# Patient Record
Sex: Female | Born: 1975 | Race: White | Hispanic: No | Marital: Married | Smoking: Never smoker
Health system: Southern US, Community
[De-identification: ages and names within clinical notes are randomized; demographics above are authoritative.]

## PROBLEM LIST (undated history)

## (undated) DIAGNOSIS — F32A Depression, unspecified: Secondary | ICD-10-CM

## (undated) DIAGNOSIS — F419 Anxiety disorder, unspecified: Secondary | ICD-10-CM

## (undated) DIAGNOSIS — K649 Unspecified hemorrhoids: Secondary | ICD-10-CM

## (undated) DIAGNOSIS — E119 Type 2 diabetes mellitus without complications: Secondary | ICD-10-CM

## (undated) DIAGNOSIS — J45909 Unspecified asthma, uncomplicated: Secondary | ICD-10-CM

## (undated) DIAGNOSIS — K219 Gastro-esophageal reflux disease without esophagitis: Secondary | ICD-10-CM

## (undated) DIAGNOSIS — M1990 Unspecified osteoarthritis, unspecified site: Secondary | ICD-10-CM

## (undated) DIAGNOSIS — F329 Major depressive disorder, single episode, unspecified: Secondary | ICD-10-CM

## (undated) DIAGNOSIS — O99619 Diseases of the digestive system complicating pregnancy, unspecified trimester: Secondary | ICD-10-CM

## (undated) HISTORY — DX: Type 2 diabetes mellitus without complications: E11.9

## (undated) HISTORY — DX: Unspecified hemorrhoids: K64.9

## (undated) HISTORY — DX: Depression, unspecified: F32.A

## (undated) HISTORY — DX: Diseases of the digestive system complicating pregnancy, unspecified trimester: O99.619

## (undated) HISTORY — DX: Gastro-esophageal reflux disease without esophagitis: K21.9

## (undated) HISTORY — DX: Anxiety disorder, unspecified: F41.9

## (undated) HISTORY — DX: Major depressive disorder, single episode, unspecified: F32.9

## (undated) HISTORY — DX: Unspecified asthma, uncomplicated: J45.909

## (undated) HISTORY — DX: Unspecified osteoarthritis, unspecified site: M19.90

## (undated) HISTORY — PX: BUNIONECTOMY: SHX129

---

## 2012-09-08 ENCOUNTER — Inpatient Hospital Stay
Admit: 2012-09-08 | Discharge: 2012-09-08 | Disposition: A | Discharge status: Home / Self Care | Attending: General Practice

## 2012-09-08 MED ORDER — ALBUTEROL SULFATE HFA 108 (90 BASE) MCG/ACT IN AERS
108 (90 Base) MCG/ACT | Freq: Four times a day (QID) | RESPIRATORY_TRACT | Status: AC | PRN
Start: 2012-09-08 — End: 2012-09-15

## 2012-09-08 MED ORDER — AZITHROMYCIN 250 MG PO TABS
250 MG | PACK | ORAL | Status: AC
Start: 2012-09-08 — End: 2012-09-18

## 2012-09-08 MED ORDER — FLUTICASONE-SALMETEROL 250-50 MCG/DOSE IN AEPB
250-50 MCG/DOSE | Freq: Two times a day (BID) | RESPIRATORY_TRACT | Status: AC
Start: 2012-09-08 — End: ?

## 2012-09-08 MED ADMIN — ipratropium-albuterol (DUONEB) nebulizer solution 1 ampule: RESPIRATORY_TRACT | @ 20:00:00 | NDC 00487020101

## 2012-09-08 MED FILL — IPRATROPIUM-ALBUTEROL 0.5-2.5 (3) MG/3ML IN SOLN: RESPIRATORY_TRACT | Qty: 3

## 2012-09-08 NOTE — Discharge Instructions (Signed)
Asthma, Adult  Asthma is a disease of the lungs and can make it hard to breathe. Asthma cannot be cured, but medicine can help control it. Asthma may be started (triggered) by:   Pollen.   Dust.   Animal skin flakes (dander).   Molds.   Foods.   Respiratory infections (colds, flu).   Smoke.   Exercise.   Stress.   Other things that cause allergic reactions or allergies (allergens).  HOME CARE    Talk to your doctor about how to manage your attacks at home. This may include:   Using a tool called a peak flow meter.   Having medicine ready to stop the attack.   Take all medicine as told by your doctor.   Wash bed sheets and blankets every week in hot water and put them in the dryer.   Drink enough fluids to keep your pee (urine) clear or pale yellow.   Always be ready to get emergency help. Write down the phone number for your doctor. Keep it where you can easily find it.   Talk about exercise routines with your doctor.   If animal dander is causing your asthma, you may need to find a new home for your pet(s).  GET HELP RIGHT AWAY IF:    You have muscle aches.   You cough more.   You have chest pain.   You have thick spit (sputum) that changes to yellow, green, gray, or bloody.   Medicine does not stop your wheezing.   You have problems breathing.   You have a fever.   Your medicine causes:   A rash.   Itching.   Puffiness (swelling).   Breathing problems.  MAKE SURE YOU:    Understand these instructions.   Will watch your condition.   Will get help right away if you are not doing well or get worse.  Document Released: 03/27/2008 Document Revised: 01/01/2012 Document Reviewed: 08/19/2008  [facility] Patient Information 2013 [place].

## 2012-09-08 NOTE — ED Provider Notes (Signed)
The history is provided by the patient.   Patient states has a history of asthma is out of her inhalers states her doctor passed away and is on a waiting list for a new doctor. States for the past two weeks has had difficulty with her asthma.  Denies any fever.    Review of Systems   Constitutional: Negative for fever and chills.   HENT: Negative for ear pain, sore throat and sinus pressure.    Eyes: Negative for pain, discharge and redness.   Respiratory: Positive for wheezing. Negative for cough and shortness of breath.    Cardiovascular: Negative for chest pain.   Gastrointestinal: Negative for nausea, vomiting, diarrhea and abdominal distention.   Genitourinary: Negative for dysuria and frequency.   Musculoskeletal: Negative for back pain and arthralgias.   Skin: Negative for rash and wound.   Neurological: Negative for weakness and headaches.   Hematological: Negative for adenopathy.   All other systems reviewed and are negative.        Physical Exam   Nursing note and vitals reviewed.  Constitutional: She is oriented to person, place, and time. She appears well-developed and well-nourished.   HENT:   Head: Normocephalic and atraumatic.   Right Ear: Hearing and external ear normal.   Left Ear: Hearing and external ear normal.   Nose: Nose normal.   Mouth/Throat: Uvula is midline, oropharynx is clear and moist and mucous membranes are normal. No oropharyngeal exudate.   Eyes: Conjunctivae, EOM and lids are normal. Pupils are equal, round, and reactive to light.   Neck: Normal range of motion. Neck supple.   Cardiovascular: Normal rate, regular rhythm and normal heart sounds.    No murmur heard.  Pulmonary/Chest: Effort normal. She has wheezes.   Abdominal: Soft. Bowel sounds are normal. There is no tenderness. There is no rigidity, no rebound, no guarding and no CVA tenderness.   Musculoskeletal: She exhibits no edema.   Neurological: She is alert and oriented to person, place, and time. She has normal  strength. No cranial nerve deficit or sensory deficit. Coordination and gait normal. GCS eye subscore is 4. GCS verbal subscore is 5. GCS motor subscore is 6.   Skin: Skin is warm and dry. No abrasion and no rash noted.       Procedures    MDM    Labs      Radiology      EKG Interpretation.    --------------------------------------------- PAST HISTORY ---------------------------------------------  Past Medical History:  has no past medical history on file.    Past Surgical History:  has no past surgical history on file.    Social History:      Family History: family history is not on file.     The patient???s home medications have been reviewed.    Allergies: Review of patient's allergies indicates no known allergies.    -------------------------------------------------- RESULTS -------------------------------------------------  No results found for this visit on 09/08/12.       ------------------------- NURSING NOTES AND VITALS REVIEWED ---------------------------   The nursing notes within the ED encounter and vital signs as below have been reviewed.   BP 117/70   Pulse 98   Temp(Src) 98 ??F (36.7 ??C) (Oral)   Resp 16   Wt 112 lb (50.803 kg)   SpO2 100%   LMP 09/08/2012  Oxygen Saturation Interpretation: Normal      ------------------------------------------ PROGRESS NOTES ------------------------------------------   I have spoken with the patient and discussed today???s results, in addition to  providing specific details for the plan of care and counseling regarding the diagnosis and prognosis.  Their questions are answered at this time and they are agreeable with the plan.      --------------------------------- ADDITIONAL PROVIDER NOTES ---------------------------------          This patient is stable for discharge.  I have shared the specific conditions for return, as well as the importance of follow-up.  After breathing treatment patient states she feels much better. Lungs CTA.    ---------------------------------  IMPRESSION AND DISPOSITION ---------------------------------    IMPRESSION  1. Asthmatic bronchitis        DISPOSITION  Disposition: Discharge to home  Patient condition is good      Marcene Duos, DO  09/08/12 1515

## 2013-04-08 LAB — HM MAMMOGRAPHY: HM Mammogram: NORMAL (ref 0–4)

## 2013-07-03 ENCOUNTER — Inpatient Hospital Stay: Admit: 2013-07-03 | Discharge: 2013-07-03 | Discharge status: Against Medical Advice | Attending: Emergency Medicine

## 2013-07-03 LAB — URINALYSIS
Bilirubin Urine: NEGATIVE
Blood, Urine: NEGATIVE
Glucose, Ur: NEGATIVE mg/dL
Ketones, Urine: NEGATIVE mg/dL
Leukocyte Esterase, Urine: NEGATIVE
Nitrite, Urine: NEGATIVE
Protein, UA: NEGATIVE mg/dL
Specific Gravity, UA: 1.025 (ref 1.005–1.030)
Urobilinogen, Urine: 0.2 E.U./dL (ref <2.0)
pH, UA: 7 (ref 5.0–9.0)

## 2013-07-03 LAB — PREGNANCY, URINE: HCG(Urine) Pregnancy Test: NEGATIVE

## 2013-07-03 MED ORDER — SULFAMETHOXAZOLE-TRIMETHOPRIM 800-160 MG PO TABS
800-160 MG | ORAL_TABLET | Freq: Two times a day (BID) | ORAL | Status: AC
Start: 2013-07-03 — End: 2013-07-13

## 2013-07-03 NOTE — ED Provider Notes (Signed)
Patient is a 37 y.o. female presenting with dysuria. The history is provided by the patient.   Dysuria   This is a new problem. The current episode started more than 1 week ago. The problem occurs every urination. The quality of the pain is described as burning. The pain is moderate. Associated symptoms include frequency, urgency and flank pain. Pertinent negatives include no chills, no nausea, no vomiting, no discharge, no hematuria and no hesitancy.       Review of Systems   Constitutional: Negative for fever and chills.   HENT: Negative for ear pain, sore throat and sinus pressure.    Eyes: Negative for pain, discharge and redness.   Respiratory: Negative for cough, shortness of breath and wheezing.    Cardiovascular: Negative for chest pain.   Gastrointestinal: Negative for nausea, vomiting, diarrhea and abdominal distention.   Genitourinary: Positive for dysuria, urgency, frequency and flank pain. Negative for hesitancy and hematuria.   Musculoskeletal: Negative for back pain and arthralgias.   Skin: Negative for rash and wound.   Neurological: Negative for weakness and headaches.   Hematological: Negative for adenopathy.   All other systems reviewed and are negative.        Physical Exam   Constitutional: She is oriented to person, place, and time. She appears well-developed and well-nourished.   HENT:   Head: Normocephalic and atraumatic.   Eyes: Pupils are equal, round, and reactive to light.   Neck: Normal range of motion. Neck supple.   Cardiovascular: Normal rate, regular rhythm and normal heart sounds.    No murmur heard.  Pulmonary/Chest: Effort normal and breath sounds normal. No respiratory distress. She has no wheezes. She has no rales.   Abdominal: Soft. Bowel sounds are normal. There is tenderness in the suprapubic area. There is CVA tenderness. There is no rebound and no guarding.   Musculoskeletal: She exhibits no edema.   Neurological: She is alert and oriented to person, place, and time. No  cranial nerve deficit. Coordination normal.   Skin: Skin is warm and dry.   Nursing note and vitals reviewed.      Procedures    MDM    --------------------------------------------- PAST HISTORY ---------------------------------------------  Past Medical History:  has no past medical history on file.    Past Surgical History:  has no past surgical history on file.    Social History:  reports that she has never smoked. She does not have any smokeless tobacco history on file.    Family History: family history is not on file.     The patient???s home medications have been reviewed.    Allergies: Review of patient's allergies indicates no known allergies.    -------------------------------------------------- RESULTS -------------------------------------------------  Results for orders placed during the hospital encounter of 07/03/13   URINALYSIS       Result Value Range    Color, UA Yellow  Straw/Yellow    Clarity, UA Clear  Clear    Glucose, Ur Negative  Negative mg/dL    Bilirubin Urine Negative  Negative    Ketones, Urine Negative  Negative mg/dL    Specific Gravity, UA 1.025  1.005 - 1.030    Blood, Urine Negative  Negative    pH, UA 7.0  5.0 - 9.0    Protein, UA Negative  Negative mg/dL    Urobilinogen, Urine 0.2  < 2.0 E.U./dL    Nitrite, Urine Negative  Negative    Leukocyte Esterase, Urine Negative  Negative   PREGNANCY, URINE  Result Value Range    HCG(Urine) Pregnancy Test NEGATIVE  NEGATIVE          ------------------------- NURSING NOTES AND VITALS REVIEWED ---------------------------   The nursing notes within the ED encounter and vital signs as below have been reviewed.   BP 97/42   Pulse 83   Temp(Src) 98.6 ??F (37 ??C) (Oral)   Resp 12   Wt 116 lb (52.617 kg)   LMP 06/08/2013  Oxygen Saturation Interpretation: Normal      ------------------------------------------ PROGRESS NOTES ------------------------------------------   I have spoken with the patient and discussed today???s results, in addition to  providing specific details for the plan of care and counseling regarding the diagnosis and prognosis.  Their questions are answered at this time and they are agreeable with the plan.      --------------------------------- ADDITIONAL PROVIDER NOTES ---------------------------------          This patient is stable for discharge.  I have shared the specific conditions for return, as well as the importance of follow-up.              Delma Officer, MD  07/03/13 [phone]

## 2013-07-03 NOTE — Discharge Instructions (Signed)
Painful Urination (Dysuria): After Your Visit  Your Care Instructions  Burning pain with urination (dysuria) is a common symptom of a urinary tract infection or other urinary problems. The bladder may become inflamed. This can cause pain when the bladder fills and empties. You may also feel pain if the tube that carries urine from the bladder to the outside of the body (urethra) gets irritated or infected.  Sexually transmitted infections (STIs) also may cause pain when you urinate.  Sometimes the pain can be caused by things other than an infection. The urethra can be irritated by soaps, perfumes, or foreign objects in the urethra. Kidney stones can cause pain when they pass through the urethra.  The cause may be hard to find. You may need tests. Treatment for painful urination depends on the cause.  Follow-up care is a key part of your treatment and safety. Be sure to make and go to all appointments, and call your doctor if you are having problems. It's also a good idea to know your test results and keep a list of the medicines you take.  How can you care for yourself at home?   Drink extra water and juices such as cranberry and blueberry juices for the next day or two. This will help make the urine less concentrated. And it may help wash out any bacteria that may be causing an infection. (If you have kidney, heart, or liver disease and have to limit fluids, talk with your doctor before you increase the amount of fluids you drink.)   Avoid drinks that are carbonated or have caffeine. They can irritate the bladder.   Urinate often. Try to empty your bladder each time.  For women:   Urinate right after you have sex.   After going to the bathroom, wipe from front to back.   Avoid douches, bubble baths, and feminine hygiene sprays. And avoid other feminine hygiene products that have deodorants.  When should you call for help?  Call your doctor now or seek immediate medical care if:   You have new symptoms,  such as fever, nausea, or vomiting.   You have new or worse symptoms of a urinary problem. For example:   You have blood or pus in your urine.   You have chills or body aches.   It hurts worse to urinate.   You have groin or belly pain.   You have pain in your back just below your rib cage (the flank area).  Watch closely for changes in your health, and be sure to contact your doctor if you have any problems.   Where can you learn more?   Go to [email or web address] and sign in to your MyChart account. Enter [id number] in the [place] box to learn more about "Painful Urination (Dysuria): After Your Visit."    If you do not have an account, please click on the "Sign Up Now" link.      2006-2014 [employer]. Care instructions adapted under license by [facility]. This care instruction is for use with your licensed healthcare professional. If you have questions about a medical condition or this instruction, always ask your healthcare professional. [place] any warranty or liability for your use of this information.  Content Version: 10.1.311062; Current as of: January 12, 2012

## 2015-08-02 LAB — HM MAMMOGRAPHY: HM Mammogram: NORMAL (ref 0–4)

## 2016-05-03 ENCOUNTER — Inpatient Hospital Stay: Admit: 2016-05-03 | Discharge: 2016-05-03 | Disposition: A | Discharge status: Home / Self Care

## 2016-05-03 DIAGNOSIS — N3001 Acute cystitis with hematuria: Secondary | ICD-10-CM

## 2016-05-03 LAB — PREGNANCY, URINE: HCG(Urine) Pregnancy Test: NEGATIVE

## 2016-05-03 LAB — MICROSCOPIC URINALYSIS

## 2016-05-03 LAB — URINALYSIS
Bilirubin Urine: NEGATIVE
Glucose, Ur: NEGATIVE mg/dL
Nitrite, Urine: NEGATIVE
Protein, UA: NEGATIVE mg/dL
Specific Gravity, UA: 1.02 (ref 1.005–1.030)
Urobilinogen, Urine: 0.2 E.U./dL (ref <2.0)
pH, UA: 6.5 (ref 5.0–9.0)

## 2016-05-03 MED ORDER — CEPHALEXIN 500 MG PO CAPS
500 MG | ORAL_CAPSULE | Freq: Three times a day (TID) | ORAL | 0 refills | Status: AC
Start: 2016-05-03 — End: 2016-05-10

## 2016-05-03 MED ORDER — FLUCONAZOLE 150 MG PO TABS
150 MG | ORAL_TABLET | Freq: Once | ORAL | 0 refills | Status: AC
Start: 2016-05-03 — End: 2016-05-03

## 2016-05-03 NOTE — ED Provider Notes (Signed)
HPI Comments: This is a 40 year old female presents to urgent care complaining of urinary problems for the past couple days. States a history of urinary complaints in the past and UTIs in the past. Has been taking over-the-counter AZO. Denies any chest pain or soreness breath. No vaginal bleeding or discharge.      Review of Systems   Constitutional: Negative for chills and fever.   HENT: Negative for ear pain, sinus pressure and sore throat.    Eyes: Negative for pain, discharge and redness.   Respiratory: Negative for cough, shortness of breath and wheezing.    Cardiovascular: Negative for chest pain.   Gastrointestinal: Negative for abdominal distention, diarrhea, nausea and vomiting.   Genitourinary: Positive for difficulty urinating and dysuria. Negative for frequency.   Musculoskeletal: Negative for arthralgias and back pain.   Skin: Negative for rash and wound.   Neurological: Negative for weakness and headaches.   Hematological: Negative for adenopathy.   All other systems reviewed and are negative.      Physical Exam   Constitutional: She is oriented to person, place, and time. She appears well-developed and well-nourished.   HENT:   Head: Normocephalic and atraumatic.   Right Ear: Hearing and external ear normal.   Left Ear: Hearing and external ear normal.   Nose: Nose normal.   Mouth/Throat: Uvula is midline, oropharynx is clear and moist and mucous membranes are normal.   Eyes: Conjunctivae, EOM and lids are normal. Pupils are equal, round, and reactive to light.   Neck: Normal range of motion. Neck supple.   Cardiovascular: Normal rate, regular rhythm and normal heart sounds.    No murmur heard.  Pulmonary/Chest: Effort normal and breath sounds normal.   Abdominal: Soft. Bowel sounds are normal. There is no tenderness. There is no rigidity, no rebound, no guarding and no CVA tenderness.   Musculoskeletal: She exhibits no edema.   Neurological: She is alert and oriented to person, place, and time. She  has normal strength. No cranial nerve deficit or sensory deficit. Coordination and gait normal. GCS eye subscore is 4. GCS verbal subscore is 5. GCS motor subscore is 6.   Skin: Skin is warm and dry. No abrasion and no rash noted.   Nursing note and vitals reviewed.      Procedures    MDM    --------------------------------------------- PAST HISTORY ---------------------------------------------  Past Medical History:  has no past medical history on file.    Past Surgical History:  has no past surgical history on file.    Social History:  reports that she has never smoked. She does not have any smokeless tobacco history on file.    Family History: family history is not on file.     The patient???s home medications have been reviewed.    Allergies: Review of patient's allergies indicates no known allergies.    -------------------------------------------------- RESULTS -------------------------------------------------  Results for orders placed or performed during the hospital encounter of 05/03/16   Urinalysis   Result Value Ref Range    Color, UA Yellow Straw/Yellow    Clarity, UA SL CLOUDY Clear    Glucose, Ur Negative Negative mg/dL    Bilirubin Urine Negative Negative    Ketones, Urine TRACE (A) Negative mg/dL    Specific Gravity, UA 1.020 1.005 - 1.030    Blood, Urine MODERATE (A) Negative    pH, UA 6.5 5.0 - 9.0    Protein, UA Negative Negative mg/dL    Urobilinogen, Urine 0.2 <2.0 E.U./dL  Nitrite, Urine Negative Negative    Leukocyte Esterase, Urine SMALL (A) Negative   Pregnancy, Urine   Result Value Ref Range    HCG(Urine) Pregnancy Test NEGATIVE NEGATIVE   Microscopic Urinalysis   Result Value Ref Range    WBC, UA 10-20 (A) 0 - 5 /HPF    RBC, UA 5-10 (A) 0 - 2 /HPF    Epi Cells MODERATE /HPF    Bacteria, UA MODERATE (A) /HPF    Yeast, UA RARE      No orders to display       ------------------------- NURSING NOTES AND VITALS REVIEWED ---------------------------   The nursing notes within the ED encounter and  vital signs as below have been reviewed.   BP 114/71   Pulse 80   Temp 99.1 ??F (37.3 ??C) (Oral)    Resp 14   Wt 125 lb (56.7 kg)   LMP 04/17/2016   SpO2 98%  Oxygen Saturation Interpretation: Normal      ------------------------------------------ PROGRESS NOTES ------------------------------------------   I have spoken with the patient and discussed today???s results, in addition to providing specific details for the plan of care and counseling regarding the diagnosis and prognosis.  Their questions are answered at this time and they are agreeable with the plan.      --------------------------------- ADDITIONAL PROVIDER NOTES ---------------------------------      This patient is stable for discharge.  I have shared the specific conditions for return, as well as the importance of follow-up.      * NOTE: This report was transcribed using voice recognition software. Every effort was made to ensure accuracy; however, inadvertent computerized transcription errors may be present.    --------------------------------- IMPRESSION AND DISPOSITION ---------------------------------    IMPRESSION  1. Acute cystitis with hematuria    2. Yeast infection        DISPOSITION  Disposition: Discharge to home  Patient condition is good         Sherryl Barters, [place]  05/03/16 [phone]

## 2016-05-06 LAB — CULTURE, URINE: Urine Culture, Routine: <10000

## 2016-08-07 ENCOUNTER — Ambulatory Visit (INDEPENDENT_AMBULATORY_CARE_PROVIDER_SITE_OTHER): Payer: BLUE CROSS/BLUE SHIELD | Admitting: Physician Assistant

## 2016-08-07 ENCOUNTER — Encounter: Payer: Self-pay | Admitting: Physician Assistant

## 2016-08-07 VITALS — BP 100/70 | HR 81 | Temp 98.0°F | Resp 16 | Ht 65.0 in | Wt 127.0 lb

## 2016-08-07 DIAGNOSIS — J4540 Moderate persistent asthma, uncomplicated: Secondary | ICD-10-CM

## 2016-08-07 DIAGNOSIS — F411 Generalized anxiety disorder: Secondary | ICD-10-CM

## 2016-08-07 DIAGNOSIS — G8929 Other chronic pain: Secondary | ICD-10-CM | POA: Diagnosis not present

## 2016-08-07 DIAGNOSIS — M542 Cervicalgia: Secondary | ICD-10-CM

## 2016-08-07 DIAGNOSIS — F329 Major depressive disorder, single episode, unspecified: Secondary | ICD-10-CM | POA: Diagnosis not present

## 2016-08-07 DIAGNOSIS — F32A Depression, unspecified: Secondary | ICD-10-CM

## 2016-08-07 MED ORDER — CITALOPRAM HYDROBROMIDE 40 MG PO TABS: 40.0000 mg | ORAL_TABLET | Freq: Every day | ORAL | 3 refills | Status: AC

## 2016-08-07 MED ORDER — ALBUTEROL SULFATE HFA 108 (90 BASE) MCG/ACT IN AERS: 2.0000 | INHALATION_SPRAY | Freq: Four times a day (QID) | RESPIRATORY_TRACT | 0 refills | Status: DC | PRN

## 2016-08-07 MED ORDER — FLUTICASONE FUROATE-VILANTEROL 100-25 MCG/INH IN AEPB: 1.0000 | INHALATION_SPRAY | Freq: Every day | RESPIRATORY_TRACT | 0 refills | Status: DC

## 2016-08-08 NOTE — Progress Notes (Signed)
Patient ID: Anna ChesterfieldHolly Clutter MRN: [id number], DOB: 02/09/1976, 40 y.o. Date of Encounter: @DATE @  Chief Complaint:  Chief Complaint  Patient presents with  . Annual Exam    PGQ SCORE 12    HPI: 40 y.o. year old female  presents as a New Pt to Establish Care.   Initially, she was scheduled for CPE but because of so many issues to address and time constraints, more pressing issues were addressed at this visit and she will return for CPE.   She and her family recently moved here from [place] in July.  Her Dad lives here. They moved to be near him, for better job opportunities, and [place]   Says that her prior PCP in [place] died unexpectedly.  She has been waiting to establsih a new PCP so she could address these issues.   Says her Gyn had initially Rxed Celexa for anxiety.  Says she has recently been experiencing increased anxiety and depression.  She becomes tearful and remains tearful through the remainder of the OV.   She also wants to make me aware of the following info, which she then provides:  Her father was adopted. He just recently learned about his biological family and just learned that family history. Learned that his mother was dxed with and died from Breast Cancer--at age 42.  Pt notes that her last mammogram report, which she brought, showed that she has Dense Breast Tissue.   Also says that she has h/o recurrent UTI. Brngs paper work from her last UTI which was July---at that time was treated with Keflex---and also Diflucna--b/c it also showed a yeast infection.  Also brings records of Cervicla Spine XRay---says she has constant pain. Says she has "Cervicla traction and an Inversion Table at home, that she uses"  Also brings past AVS that documents "Asthmatic Bronchitis" as the Dx and says that "this happens any time she gets a chest cold"  Also shows me that at that time she was treated with Albuterol, Advair, Azithromycin.  Says she is out of/needs  refill for Albuterol and Advair. However, says she doesn't like the Advair--"its particles"--wants to try different inhaler.  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 When she has menses--that area throbs--so she wears a knee brace.   Says that she is bringing her daughter, Jennette BankerMaedline, to see my Wednesday.  Says she has Autism, Learning Disability, Sensory Processing D/O, Oppositional Defiant Disorder, ADD.  Says she is 40 y/o. She dresses like a boy. She thinks she likes girls.  She feels like she is a failure as a parent.   Says her husband is also seeing me next week. He is "experiencing out of the roof anxiety".  Says she also has a step son who is 13 y/o---was from husbands prior marriage---he has ODD, ADD, "is emotionally disturbed. He is 42 y/o"  I aksed if she works outside of the home.  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  Is looking for a part time job to work in the evenings.   Says she uses Albuterol > 2 times per week.   I told her today I will increase her Celexa, refill her Pulmonary meds, and will have her schedule CPE at there convenience in the next 1 -2 weeks.  She is agreeable.    Past Medical History:  Diagnosis Date  . Anxiety   . Arthritis   . Asthma   . Depression   .  Diabetes mellitus without complication (HCC)      Home Meds: No outpatient prescriptions prior to visit.   No facility-administered medications prior to visit.     Allergies: Not on File  Social History   Social History  . Marital status: Married    Spouse name: N/A  . Number of children: N/A  . Years of education: N/A   Occupational History  . Not on file.   Social History Main Topics  . Smoking status: Never Smoker  . Smokeless tobacco: Never Used  . Alcohol use Yes     Comment: wine cooler x5 year  . Drug use: No  . Sexual activity: Yes   Other Topics Concern  . Not on file   Social History Narrative  . No narrative on file    Family History    Problem Relation Age of Onset  . Diabetes Father   . Hyperlipidemia Father   . Hypertension Father   . Kidney disease Maternal Uncle   . Arthritis Maternal Grandmother   . Cancer Paternal Grandmother      Review of Systems:  See HPI for pertinent ROS. All other ROS negative.    Physical Exam: Blood pressure 100/70, pulse 81, temperature 98 F (36.7 C), temperature source Oral, resp. rate 16, height 5\' 5"  (1.651 m), weight 127 lb (57.6 kg), last menstrual period 08/06/2016, SpO2 98 %., Body mass index is 21.13 kg/m. General: WNWD WF. Appears in no acute distress. Neck: Supple. No thyromegaly. No lymphadenopathy. Lungs: Clear bilaterally to auscultation without wheezes, rales, or rhonchi. Breathing is unlabored. Heart: RRR with S1 S2. No murmurs, rubs, or gallops. Abdomen: Soft, non-tender, non-distended with normoactive bowel sounds. No hepatomegaly. No rebound/guarding. No obvious abdominal masses. Musculoskeletal:  Strength and tone normal for age. Extremities/Skin: Warm and dry. Neuro: Alert and oriented X 3. Moves all extremities spontaneously. Gait is normal. CNII-XII grossly in tact. Psych:  She is teary through the entire visit. But, she is appropriate with normal eye contact, appropriate.      ASSESSMENT AND PLAN:  40 y.o. year old female with  1. Generalized anxiety disorder Will increase her Celexa to 40mg  QD. Discussed proper expectations o fmedication dose increase--that it will take weeks to see effect.  - citalopram (CELEXA) 40 MG tablet; Take 1 tablet (40 mg total) by mouth daily.  Dispense: 30 tablet; Refill: 3  2. Depression, unspecified depression type Will increase her Celexa to 40mg  QD. Discussed proper expectations o fmedication dose increase--that it will take weeks to see effect.  - citalopram (CELEXA) 40 MG tablet; Take 1 tablet (40 mg total) by mouth daily.  Dispense: 30 tablet; Refill: 3  3. Moderate persistent asthma without complication Will change  Advair to [facility] and see if she likes this any better. Refill Albuterol. - albuterol (PROVENTIL HFA;VENTOLIN HFA) 108 (90 Base) MCG/ACT inhaler; Inhale 2 puffs into the lungs every 6 (six) hours as needed for wheezing or shortness of breath.  Dispense: 1 Inhaler; Refill: 0 - fluticasone furoate-vilanterol (BREO ELLIPTA) 100-25 MCG/INH AEPB; Inhale 1 puff into the lungs daily.  Dispense: 1 each; Refill: 0  4. 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  She will return in 6 -8 weeks to f/u anxiety/depression on increased dose Celexa.   45 minutes spent with patient in direct face-to-face discussion.    [address] [place], [place], [facility] 08/08/2016 12:01 PM

## 2016-08-16 ENCOUNTER — Telehealth: Payer: Self-pay

## 2016-08-16 NOTE — Telephone Encounter (Signed)
A prior Auth is req for Standard PacificBreo Ellipta  The alternatives are Symbicort 80/4.5 INH Aersol 160/4.5 Dulera Inhaler13GM/100/5 Dulera Inhaler 13gm/200/5  Which would you like to change it to?

## 2016-08-16 NOTE — Telephone Encounter (Signed)
The Symbicort.

## 2016-08-22 ENCOUNTER — Telehealth: Payer: Self-pay

## 2016-08-22 NOTE — Telephone Encounter (Signed)
PA started for symbicort

## 2016-08-23 ENCOUNTER — Telehealth: Payer: Self-pay

## 2016-08-23 MED ORDER — BUDESONIDE-FORMOTEROL FUMARATE 80-4.5 MCG/ACT IN AERO: 2.0000 | INHALATION_SPRAY | Freq: Two times a day (BID) | RESPIRATORY_TRACT | 3 refills | Status: DC

## 2016-08-23 NOTE — Telephone Encounter (Signed)
Symbicort covered under ins

## 2016-08-25 NOTE — Telephone Encounter (Signed)
PA was switched to Symbicort and approved 11-1

## 2016-09-18 ENCOUNTER — Encounter: Payer: BLUE CROSS/BLUE SHIELD | Admitting: Physician Assistant

## 2016-10-03 ENCOUNTER — Other Ambulatory Visit: Payer: Self-pay | Admitting: Family Medicine

## 2016-10-03 DIAGNOSIS — Z1231 Encounter for screening mammogram for malignant neoplasm of breast: Secondary | ICD-10-CM

## 2016-10-23 NOTE — L&D Delivery Note (Signed)
Delivery Note   Anna Burch is a 41 y.o. [id number] at 5634w2d Estimated Date of Delivery: 10/16/17  PRE-OPERATIVE DIAGNOSIS:  1) 7434w2d pregnancy.   POST-OPERATIVE DIAGNOSIS:  1) 6534w2d pregnancy s/p Vaginal, Spontaneous   Delivery Type: Vaginal, Spontaneous    Delivery Anesthesia: Epidural   Labor Complications:   prolonged deceleration at complete dilatation     ESTIMATED BLOOD LOSS: 199  ml    FINDINGS:   1) female infant, Apgar scores of 7    at 1 minute and 9    at 5 minutes and a birthweight of 86.77  ounces.    2) Nuchal cord: no  SPECIMENS:   PLACENTA:   Appearance: Intact    Removal: Spontaneous      Disposition:   3 vessel cord, cord blood collect for public donation and for type and screen. It will be held per protocol then discarded.   DISPOSITION:  Infant to left in stable condition in the delivery room, with L&D personnel and mother,  NARRATIVE SUMMARY: Labor course:  Ms. Anna Burch is a [id number] at 6934w2d who presented for SROM.  She progressed well in labor with pitocin.  She received the appropriate anesthesia and proceeded to complete dilation. She evidenced good maternal expulsive effort during the second stage. She went on to deliver a viable female  Infant "Anna Burch" . The placenta delivered without problems and was noted to be complete. A perineal and vaginal examination was performed. Episiotomy/Lacerations: 1st degree;Perineal  Episiotomy or lacerations were repaired with 3-0  Vicryl Rapide suture.   Doreene Burkennie Willie Loy, [name]  10/04/2017 7:13 AM

## 2016-11-07 ENCOUNTER — Ambulatory Visit: Payer: Self-pay

## 2016-12-01 ENCOUNTER — Ambulatory Visit: Payer: Self-pay

## 2016-12-07 ENCOUNTER — Ambulatory Visit
Admission: RE | Admit: 2016-12-07 | Discharge: 2016-12-07 | Disposition: A | Discharge status: Home / Self Care | Payer: BLUE CROSS/BLUE SHIELD | Source: Ambulatory Visit | Attending: Family Medicine | Admitting: Family Medicine

## 2016-12-07 ENCOUNTER — Other Ambulatory Visit: Payer: Self-pay | Admitting: Family Medicine

## 2016-12-07 DIAGNOSIS — Z1231 Encounter for screening mammogram for malignant neoplasm of breast: Secondary | ICD-10-CM | POA: Insufficient documentation

## 2016-12-15 ENCOUNTER — Other Ambulatory Visit: Payer: Self-pay | Admitting: *Deleted

## 2016-12-15 ENCOUNTER — Inpatient Hospital Stay
Admission: RE | Admit: 2016-12-15 | Discharge: 2016-12-15 | Disposition: A | Discharge status: Home / Self Care | Payer: Self-pay | Source: Ambulatory Visit | Attending: *Deleted | Admitting: *Deleted

## 2016-12-15 DIAGNOSIS — Z9289 Personal history of other medical treatment: Secondary | ICD-10-CM

## 2017-03-13 ENCOUNTER — Encounter: Payer: Self-pay | Admitting: Obstetrics and Gynecology

## 2017-03-13 ENCOUNTER — Ambulatory Visit (INDEPENDENT_AMBULATORY_CARE_PROVIDER_SITE_OTHER): Payer: BLUE CROSS/BLUE SHIELD

## 2017-03-13 ENCOUNTER — Ambulatory Visit (INDEPENDENT_AMBULATORY_CARE_PROVIDER_SITE_OTHER): Payer: BLUE CROSS/BLUE SHIELD | Admitting: Obstetrics and Gynecology

## 2017-03-13 VITALS — BP 107/74 | HR 92 | Ht 65.0 in | Wt 130.2 lb

## 2017-03-13 DIAGNOSIS — O09529 Supervision of elderly multigravida, unspecified trimester: Secondary | ICD-10-CM | POA: Diagnosis not present

## 2017-03-13 DIAGNOSIS — R768 Other specified abnormal immunological findings in serum: Secondary | ICD-10-CM | POA: Diagnosis not present

## 2017-03-13 DIAGNOSIS — N83201 Unspecified ovarian cyst, right side: Secondary | ICD-10-CM | POA: Diagnosis not present

## 2017-03-13 DIAGNOSIS — N926 Irregular menstruation, unspecified: Secondary | ICD-10-CM

## 2017-03-13 DIAGNOSIS — F419 Anxiety disorder, unspecified: Secondary | ICD-10-CM

## 2017-03-13 LAB — POCT URINE PREGNANCY: Preg Test, Ur: POSITIVE — AB

## 2017-03-13 NOTE — Patient Instructions (Signed)
First Trimester of Pregnancy The first trimester of pregnancy is from week 1 until the end of week 13 (months 1 through 3). A week after a sperm fertilizes an egg, the egg will implant on the wall of the uterus. This embryo will begin to develop into a baby. Genes from you and your partner will form the baby. The female genes will determine whether the baby will be a boy or a girl. At 6-8 weeks, the eyes and face will be formed, and the heartbeat can be seen on ultrasound. At the end of 12 weeks, all the baby's organs will be formed. Now that you are pregnant, you will want to do everything you can to have a healthy baby. Two of the most important things are to get good prenatal care and to follow your health care provider's instructions. Prenatal care is all the medical care you receive before the baby's birth. This care will help prevent, find, and treat any problems during the pregnancy and childbirth. Body changes during your first trimester Your body goes through many changes during pregnancy. The changes vary from woman to woman.  You may gain or lose a couple of pounds at first.  You may feel sick to your stomach (nauseous) and you may throw up (vomit). If the vomiting is uncontrollable, call your health care provider.  You may tire easily.  You may develop headaches that can be relieved by medicines. All medicines should be approved by your health care provider.  You may urinate more often. Painful urination may mean you have a bladder infection.  You may develop heartburn as a result of your pregnancy.  You may develop constipation because certain hormones are causing the muscles that push stool through your intestines to slow down.  You may develop hemorrhoids or swollen veins (varicose veins).  Your breasts may begin to grow larger and become tender. Your nipples may stick out more, and the tissue that surrounds them (areola) may become darker.  Your gums may bleed and may be  sensitive to brushing and flossing.  Dark spots or blotches (chloasma, mask of pregnancy) may develop on your face. This will likely fade after the baby is born.  Your menstrual periods will stop.  You may have a loss of appetite.  You may develop cravings for certain kinds of food.  You may have changes in your emotions from day to day, such as being excited to be pregnant or being concerned that something may go wrong with the pregnancy and baby.  You may have more vivid and strange dreams.  You may have changes in your hair. These can include thickening of your hair, rapid growth, and changes in texture. Some women also have hair loss during or after pregnancy, or hair that feels dry or thin. Your hair will most likely return to normal after your baby is born.  What to expect at prenatal visits During a routine prenatal visit:  You will be weighed to make sure you and the baby are growing normally.  Your blood pressure will be taken.  Your abdomen will be measured to track your baby's growth.  The fetal heartbeat will be listened to between weeks 10 and 14 of your pregnancy.  Test results from any previous visits will be discussed.  Your health care provider may ask you:  How you are feeling.  If you are feeling the baby move.  If you have had any abnormal symptoms, such as leaking fluid, bleeding, severe headaches,   or abdominal cramping.  If you are using any tobacco products, including cigarettes, chewing tobacco, and electronic cigarettes.  If you have any questions.  Other tests that may be performed during your first trimester include:  Blood tests to find your blood type and to check for the presence of any previous infections. The tests will also be used to check for low iron levels (anemia) and protein on red blood cells (Rh antibodies). Depending on your risk factors, or if you previously had diabetes during pregnancy, you may have tests to check for high blood  sugar that affects pregnant women (gestational diabetes).  Urine tests to check for infections, diabetes, or protein in the urine.  An ultrasound to confirm the proper growth and development of the baby.  Fetal screens for spinal cord problems (spina bifida) and Down syndrome.  HIV (human immunodeficiency virus) testing. Routine prenatal testing includes screening for HIV, unless you choose not to have this test.  You may need other tests to make sure you and the baby are doing well.  Follow these instructions at home: Medicines  Follow your health care provider's instructions regarding medicine use. Specific medicines may be either safe or unsafe to take during pregnancy.  Take a prenatal vitamin that contains at least 600 micrograms (mcg) of folic acid.  If you develop constipation, try taking a stool softener if your health care provider approves. Eating and drinking  Eat a balanced diet that includes fresh fruits and vegetables, whole grains, good sources of protein such as meat, eggs, or tofu, and low-fat dairy. Your health care provider will help you determine the amount of weight gain that is right for you.  Avoid raw meat and uncooked cheese. These carry germs that can cause birth defects in the baby.  Eating four or five small meals rather than three large meals a day may help relieve nausea and vomiting. If you start to feel nauseous, eating a few soda crackers can be helpful. Drinking liquids between meals, instead of during meals, also seems to help ease nausea and vomiting.  Limit foods that are high in fat and processed sugars, such as fried and sweet foods.  To prevent constipation: ? Eat foods that are high in fiber, such as fresh fruits and vegetables, whole grains, and beans. ? Drink enough fluid to keep your urine clear or pale yellow. Activity  Exercise only as directed by your health care provider. Most women can continue their usual exercise routine during  pregnancy. Try to exercise for 30 minutes at least 5 days a week. Exercising will help you: ? Control your weight. ? Stay in shape. ? Be prepared for labor and delivery.  Experiencing pain or cramping in the lower abdomen or lower back is a good sign that you should stop exercising. Check with your health care provider before continuing with normal exercises.  Try to avoid standing for long periods of time. Move your legs often if you must stand in one place for a long time.  Avoid heavy lifting.  Wear low-heeled shoes and practice good posture.  You may continue to have sex unless your health care provider tells you not to. Relieving pain and discomfort  Wear a good support bra to relieve breast tenderness.  Take warm sitz baths to soothe any pain or discomfort caused by hemorrhoids. Use hemorrhoid cream if your health care provider approves.  Rest with your legs elevated if you have leg cramps or low back pain.  If you develop   varicose veins in your legs, wear support hose. Elevate your feet for 15 minutes, 3-4 times a day. Limit salt in your diet. Prenatal care  Schedule your prenatal visits by the twelfth week of pregnancy. They are usually scheduled monthly at first, then more often in the last 2 months before delivery.  Write down your questions. Take them to your prenatal visits.  Keep all your prenatal visits as told by your health care provider. This is important. Safety  Wear your seat belt at all times when driving.  Make a list of emergency phone numbers, including numbers for family, friends, the hospital, and police and fire departments. General instructions  Ask your health care provider for a referral to a local prenatal education class. Begin classes no later than the beginning of month 6 of your pregnancy.  Ask for help if you have counseling or nutritional needs during pregnancy. Your health care provider can offer advice or refer you to specialists for help  with various needs.  Do not use hot tubs, steam rooms, or saunas.  Do not douche or use tampons or scented sanitary pads.  Do not cross your legs for long periods of time.  Avoid cat litter boxes and soil used by cats. These carry germs that can cause birth defects in the baby and possibly loss of the fetus by miscarriage or stillbirth.  Avoid all smoking, herbs, alcohol, and medicines not prescribed by your health care provider. Chemicals in these products affect the formation and growth of the baby.  Do not use any products that contain nicotine or tobacco, such as cigarettes and e-cigarettes. If you need help quitting, ask your health care provider. You may receive counseling support and other resources to help you quit.  Schedule a dentist appointment. At home, brush your teeth with a soft toothbrush and be gentle when you floss. Contact a health care provider if:  You have dizziness.  You have mild pelvic cramps, pelvic pressure, or nagging pain in the abdominal area.  You have persistent nausea, vomiting, or diarrhea.  You have a bad smelling vaginal discharge.  You have pain when you urinate.  You notice increased swelling in your face, hands, legs, or ankles.  You are exposed to fifth disease or chickenpox.  You are exposed to German measles (rubella) and have never had it. Get help right away if:  You have a fever.  You are leaking fluid from your vagina.  You have spotting or bleeding from your vagina.  You have severe abdominal cramping or pain.  You have rapid weight gain or loss.  You vomit blood or material that looks like coffee grounds.  You develop a severe headache.  You have shortness of breath.  You have any kind of trauma, such as from a fall or a car accident. Summary  The first trimester of pregnancy is from week 1 until the end of week 13 (months 1 through 3).  Your body goes through many changes during pregnancy. The changes vary from  woman to woman.  You will have routine prenatal visits. During those visits, your health care provider will examine you, discuss any test results you may have, and talk with you about how you are feeling. This information is not intended to replace advice given to you by your health care provider. Make sure you discuss any questions you have with your health care provider. Document Released: 10/03/2001 Document Revised: 09/20/2016 Document Reviewed: 09/20/2016 Elsevier Interactive Patient Education  2017 Elsevier   Inc.  

## 2017-03-13 NOTE — Progress Notes (Signed)
Subjective:     Patient ID: Anna Burch, female   DOB: 01/09/1976, 41 y.o.   MRN: [id number]  HPI Reports unsure of exact timing of LMP due to irregular menses. Thought she was going through early menopause and has been under a lot of stress with getting custody of 43 year old step son. FT homemaker.   [id number], 2 EAB, VAD x2 and SVD x1,   Tearful as spouse had vasectomy 12 years ago, and she has not had any other sexual partners. Also is taking celexa for anxiety and Diclofinac for neck pain and is worried about potential effects on fetus. Reports feeling really bloated for last 2 weeks and nausea x 2 days. Took UPT at home + 2 days ago.   Review of Systems Negative except stated in HPI    Objective:   Physical Exam A&Ox4 Well groomed female in no distress, but tearful Blood pressure 107/74, pulse 92, height 5\' 5"  (1.651 m), weight 130 lb 3.2 oz (59.1 kg), last menstrual period 01/24/2017. UPT+ Pelvic exam: VULVA: normal appearing vulva with no masses, tenderness or lesions, VAGINA: normal appearing vagina with normal color and discharge, no lesions, CERVIX: normal appearing cervix without discharge or lesions, multiparous os, UTERUS: enlarged to 11 week's size, ADNEXA: mass present right side, size 6 cm, WET MOUNT done - results: negative for pathogens, normal epithelial cells.   pelvic u/s today reveals: Indications:Dating and viability Findings:  Singleton intrauterine pregnancy is visualized with a CRL consistent with [redacted] weeks gestation, giving an (U/S) EDD of 10/16/17.  FHR: 175 CRL measurement: 23.3 mm Yolk sac and and early anatomy is normal.  Right Ovary measures 6.3 x 4.5 x 5.4 cm. Simple Right ovarian cyst measures 5.9 x 4.9 x 4.7 cm. Left Ovary measures 3.3 x 2.1 x 1.6 cm. It is normal appearance. There is evidence of a corpus luteal cyst in the Right Survey of the adnexa demonstrates no adnexal masses. There is no free peritoneal fluid in the cul de sac.  Impression: 1. 9  week Viable Singleton Intrauterine pregnancy by U/S. 2. (U/S) EDD is 10/16/17. 3. Right ovarian cyst Assessment:     Missed menses Anxiety HSV+ AMA Right ovarian cyst    Plan:     Will return within the next week for Nurse intake and labs NOB PE and repeat ultrasound with me in 3 weeks Reviewed symptoms of ovarian cyst rupture or torsion and instructed patient to notify us in the event they occur.  Landon Truax IdamayShambley, [name]

## 2017-03-22 ENCOUNTER — Encounter: Payer: Self-pay | Admitting: Obstetrics and Gynecology

## 2017-03-22 ENCOUNTER — Ambulatory Visit: Payer: BLUE CROSS/BLUE SHIELD | Admitting: Obstetrics and Gynecology

## 2017-03-22 VITALS — BP 102/63 | HR 78 | Ht 65.0 in | Wt 130.1 lb

## 2017-03-22 DIAGNOSIS — O09522 Supervision of elderly multigravida, second trimester: Secondary | ICD-10-CM

## 2017-03-22 MED ORDER — CITRANATAL BLOOM 90-1 MG PO TABS: 90.0000 mg | ORAL_TABLET | Freq: Every day | ORAL | 11 refills | Status: DC

## 2017-03-22 NOTE — Progress Notes (Signed)
Rebekah ChesterfieldHolly Odell presents for NOB nurse interview visit. Pregnancy confirmation done at [facility].  G5 .  P3 . Pregnancy education material explained and given. No cats in the home. NOB labs ordered.  HIV lab ordered, pt did not decline. Pt questions dental work in pregnancy, letter with precautions given. PNV encouraged, pt requests refill on CitraNatal Bloom, rxsent. Genetic screening options discussed. Genetic testing: Ordered. [facility] due to pt's age and request.  Pt may discuss with provider. Pt. To follow up with provider in 3 weeks for NOB physical.  All questions answered.

## 2017-03-22 NOTE — Patient Instructions (Signed)
First Trimester of Pregnancy The first trimester of pregnancy is from week 1 until the end of week 13 (months 1 through 3). During this time, your baby will begin to develop inside you. At 6-8 weeks, the eyes and face are formed, and the heartbeat can be seen on ultrasound. At the end of 12 weeks, all the baby's organs are formed. Prenatal care is all the medical care you receive before the birth of your baby. Make sure you get good prenatal care and follow all of your doctor's instructions. Follow these instructions at home: Medicines  Take over-the-counter and prescription medicines only as told by your doctor. Some medicines are safe and some medicines are not safe during pregnancy.  Take a prenatal vitamin that contains at least 600 micrograms (mcg) of folic acid.  If you have trouble pooping (constipation), take medicine that will make your stool soft (stool softener) if your doctor approves. Eating and drinking  Eat regular, healthy meals.  Your doctor will tell you the amount of weight gain that is right for you.  Avoid raw meat and uncooked cheese.  If you feel sick to your stomach (nauseous) or throw up (vomit): ? Eat 4 or 5 small meals a day instead of 3 large meals. ? Try eating a few soda crackers. ? Drink liquids between meals instead of during meals.  To prevent constipation: ? Eat foods that are high in fiber, like fresh fruits and vegetables, whole grains, and beans. ? Drink enough fluids to keep your pee (urine) clear or pale yellow. Activity  Exercise only as told by your doctor. Stop exercising if you have cramps or pain in your lower belly (abdomen) or low back.  Do not exercise if it is too hot, too humid, or if you are in a place of great height (high altitude).  Try to avoid standing for long periods of time. Move your legs often if you must stand in one place for a long time.  Avoid heavy lifting.  Wear low-heeled shoes. Sit and stand up straight.  You  can have sex unless your doctor tells you not to. Relieving pain and discomfort  Wear a good support bra if your breasts are sore.  Take warm water baths (sitz baths) to soothe pain or discomfort caused by hemorrhoids. Use hemorrhoid cream if your doctor says it is okay.  Rest with your legs raised if you have leg cramps or low back pain.  If you have puffy, bulging veins (varicose veins) in your legs: ? Wear support hose or compression stockings as told by your doctor. ? Raise (elevate) your feet for 15 minutes, 3-4 times a day. ? Limit salt in your food. Prenatal care  Schedule your prenatal visits by the twelfth week of pregnancy.  Write down your questions. Take them to your prenatal visits.  Keep all your prenatal visits as told by your doctor. This is important. Safety  Wear your seat belt at all times when driving.  Make a list of emergency phone numbers. The list should include numbers for family, friends, the hospital, and police and fire departments. General instructions  Ask your doctor for a referral to a local prenatal class. Begin classes no later than at the start of month 6 of your pregnancy.  Ask for help if you need counseling or if you need help with nutrition. Your doctor can give you advice or tell you where to go for help.  Do not use hot tubs, steam rooms, or   saunas.  Do not douche or use tampons or scented sanitary pads.  Do not cross your legs for long periods of time.  Avoid all herbs and alcohol. Avoid drugs that are not approved by your doctor.  Do not use any tobacco products, including cigarettes, chewing tobacco, and electronic cigarettes. If you need help quitting, ask your doctor. You may get counseling or other support to help you quit.  Avoid cat litter boxes and soil used by cats. These carry germs that can cause birth defects in the baby and can cause a loss of your baby (miscarriage) or stillbirth.  Visit your dentist. At home, brush  your teeth with a soft toothbrush. Be gentle when you floss. Contact a doctor if:  You are dizzy.  You have mild cramps or pressure in your lower belly.  You have a nagging pain in your belly area.  You continue to feel sick to your stomach, you throw up, or you have watery poop (diarrhea).  You have a bad smelling fluid coming from your vagina.  You have pain when you pee (urinate).  You have increased puffiness (swelling) in your face, hands, legs, or ankles. Get help right away if:  You have a fever.  You are leaking fluid from your vagina.  You have spotting or bleeding from your vagina.  You have very bad belly cramping or pain.  You gain or lose weight rapidly.  You throw up blood. It may look like coffee grounds.  You are around people who have German measles, fifth disease, or chickenpox.  You have a very bad headache.  You have shortness of breath.  You have any kind of trauma, such as from a fall or a car accident. Summary  The first trimester of pregnancy is from week 1 until the end of week 13 (months 1 through 3).  To take care of yourself and your unborn baby, you will need to eat healthy meals, take medicines only if your doctor tells you to do so, and do activities that are safe for you and your baby.  Keep all follow-up visits as told by your doctor. This is important as your doctor will have to ensure that your baby is healthy and growing well. This information is not intended to replace advice given to you by your health care provider. Make sure you discuss any questions you have with your health care provider. Document Released: 03/27/2008 Document Revised: 10/17/2016 Document Reviewed: 10/17/2016 Elsevier Interactive Patient Education  2017 Elsevier Inc.  

## 2017-03-23 ENCOUNTER — Other Ambulatory Visit: Payer: Self-pay | Admitting: Obstetrics and Gynecology

## 2017-03-23 DIAGNOSIS — O26899 Other specified pregnancy related conditions, unspecified trimester: Principal | ICD-10-CM

## 2017-03-23 DIAGNOSIS — Z6791 Unspecified blood type, Rh negative: Secondary | ICD-10-CM | POA: Insufficient documentation

## 2017-03-23 LAB — CBC WITH DIFFERENTIAL/PLATELET
BASOS ABS: 0 10*3/uL (ref 0.0–0.2)
Basos: 0 %
EOS (ABSOLUTE): 0 10*3/uL (ref 0.0–0.4)
Eos: 0 %
HEMOGLOBIN: 12.9 g/dL (ref 11.1–15.9)
Hematocrit: 39.5 % (ref 34.0–46.6)
IMMATURE GRANS (ABS): 0 10*3/uL (ref 0.0–0.1)
Immature Granulocytes: 0 %
LYMPHS: 20 %
Lymphocytes Absolute: 1.7 10*3/uL (ref 0.7–3.1)
MCH: 29.5 pg (ref 26.6–33.0)
MCHC: 32.7 g/dL (ref 31.5–35.7)
MCV: 90 fL (ref 79–97)
MONOCYTES: 7 %
Monocytes Absolute: 0.6 10*3/uL (ref 0.1–0.9)
NEUTROS PCT: 73 %
Neutrophils Absolute: 6.5 10*3/uL (ref 1.4–7.0)
PLATELETS: 274 10*3/uL (ref 150–379)
RBC: 4.38 x10E6/uL (ref 3.77–5.28)
RDW: 13.3 % (ref 12.3–15.4)
WBC: 8.9 10*3/uL (ref 3.4–10.8)

## 2017-03-23 LAB — MICROSCOPIC EXAMINATION: Casts: NONE SEEN /lpf

## 2017-03-23 LAB — ABO AND RH: RH TYPE: NEGATIVE

## 2017-03-23 LAB — URINALYSIS, ROUTINE W REFLEX MICROSCOPIC
Bilirubin, UA: NEGATIVE
GLUCOSE, UA: NEGATIVE
Ketones, UA: NEGATIVE
Leukocytes, UA: NEGATIVE
NITRITE UA: NEGATIVE
Protein, UA: NEGATIVE
SPEC GRAV UA: 1.027 (ref 1.005–1.030)
Urobilinogen, Ur: 0.2 mg/dL (ref 0.2–1.0)
pH, UA: 6 (ref 5.0–7.5)

## 2017-03-23 LAB — HIV ANTIBODY (ROUTINE TESTING W REFLEX): HIV SCREEN 4TH GENERATION: NONREACTIVE

## 2017-03-23 LAB — VARICELLA ZOSTER ANTIBODY, IGG: VARICELLA: 1384 {index} (ref >165)

## 2017-03-23 LAB — ANTIBODY SCREEN: ANTIBODY SCREEN: NEGATIVE

## 2017-03-23 LAB — RUBELLA SCREEN: Rubella Antibodies, IGG: 8.29 index (ref >0.99)

## 2017-03-23 LAB — HEPATITIS B SURFACE ANTIGEN: Hepatitis B Surface Ag: NEGATIVE

## 2017-03-25 LAB — URINE CULTURE

## 2017-03-30 ENCOUNTER — Other Ambulatory Visit: Payer: Self-pay | Admitting: Obstetrics and Gynecology

## 2017-03-30 DIAGNOSIS — N83201 Unspecified ovarian cyst, right side: Secondary | ICD-10-CM

## 2017-03-30 DIAGNOSIS — Z369 Encounter for antenatal screening, unspecified: Secondary | ICD-10-CM

## 2017-04-04 ENCOUNTER — Other Ambulatory Visit: Payer: Self-pay | Admitting: Obstetrics and Gynecology

## 2017-04-04 ENCOUNTER — Encounter: Payer: Self-pay | Admitting: Obstetrics and Gynecology

## 2017-04-04 MED ORDER — CEPHALEXIN 500 MG PO CAPS: 500.0000 mg | ORAL_CAPSULE | Freq: Two times a day (BID) | ORAL | 0 refills | Status: DC

## 2017-04-06 ENCOUNTER — Encounter: Payer: Self-pay | Admitting: Obstetrics and Gynecology

## 2017-04-06 ENCOUNTER — Ambulatory Visit (INDEPENDENT_AMBULATORY_CARE_PROVIDER_SITE_OTHER): Payer: BLUE CROSS/BLUE SHIELD

## 2017-04-06 ENCOUNTER — Ambulatory Visit (INDEPENDENT_AMBULATORY_CARE_PROVIDER_SITE_OTHER): Payer: BLUE CROSS/BLUE SHIELD | Admitting: Obstetrics and Gynecology

## 2017-04-06 VITALS — BP 112/72 | HR 83 | Wt 129.8 lb

## 2017-04-06 DIAGNOSIS — Z3481 Encounter for supervision of other normal pregnancy, first trimester: Secondary | ICD-10-CM | POA: Diagnosis not present

## 2017-04-06 DIAGNOSIS — Z3482 Encounter for supervision of other normal pregnancy, second trimester: Secondary | ICD-10-CM | POA: Diagnosis not present

## 2017-04-06 DIAGNOSIS — N83201 Unspecified ovarian cyst, right side: Secondary | ICD-10-CM

## 2017-04-06 DIAGNOSIS — Z369 Encounter for antenatal screening, unspecified: Secondary | ICD-10-CM

## 2017-04-06 DIAGNOSIS — Z3492 Encounter for supervision of normal pregnancy, unspecified, second trimester: Secondary | ICD-10-CM

## 2017-04-06 LAB — POCT URINALYSIS DIPSTICK
BILIRUBIN UA: NEGATIVE
GLUCOSE UA: NEGATIVE
Ketones, UA: NEGATIVE
Leukocytes, UA: NEGATIVE
Nitrite, UA: NEGATIVE
PH UA: 7 (ref 5.0–8.0)
RBC UA: NEGATIVE
SPEC GRAV UA: 1.01 (ref 1.010–1.025)
UROBILINOGEN UA: 0.2 U/dL

## 2017-04-06 MED ORDER — ONDANSETRON 4 MG PO TBDP: 4.0000 mg | ORAL_TABLET | Freq: Four times a day (QID) | ORAL | 1 refills | Status: DC | PRN

## 2017-04-06 NOTE — Progress Notes (Signed)
NEW OB HISTORY AND PHYSICAL  SUBJECTIVE:       Anna Burch is a 41 y.o. [id number] female, Patient's last menstrual period was 01/09/2017., Estimated Date of Delivery: 10/16/17, [id number], presents today for establishment of Prenatal Care. She has no unusual complaints and complains of lump and pain in left breast/ upper chest.      Gynecologic History Patient's last menstrual period was 01/09/2017. Normal Contraception: vasectomy Last Pap: ?[name]. Results were: normal  Obstetric History OB History  Gravida Para Term Preterm AB Living  5 3     1 3   SAB TAB Ectopic Multiple Live Births    1     3    # Outcome Date GA Lbr Len/2nd Weight Sex Delivery Anes PTL Lv  5 Current           4 Para 2006    F Vag-Spont EPI  LIV  3 Para 1999    M Vag-Spont EPI  LIV  2 Para 1998    F Vag-Spont EPI  LIV  1 TAB      TAB         Past Medical History:  Diagnosis Date  . Anxiety   . Arthritis   . Asthma   . Depression   . Diabetes mellitus without complication [facility](HCC)     Past Surgical History:  Procedure Laterality Date  . BUNIONECTOMY Left 1992 and 1998   both left and right    Current Outpatient Prescriptions on File Prior to Visit  Medication Sig Dispense Refill  . cephALEXin (KEFLEX) 500 MG capsule Take 1 capsule (500 mg total) by mouth 2 (two) times daily. 14 capsule 0  . citalopram (CELEXA) 40 MG tablet Take 1 tablet (40 mg total) by mouth daily. 30 tablet 3  . Prenatal-DSS-FeCb-FeGl-FA (CITRANATAL BLOOM) 90-1 MG TABS Take 90 mg by mouth daily. 30 tablet 11   No current facility-administered medications on file prior to visit.     No Known Allergies  Social History   Social History  . Marital status: Married    Spouse name: N/A  . Number of children: N/A  . Years of education: N/A   Occupational History  . Not on file.   Social History Main Topics  . Smoking status: Never Smoker  . Smokeless tobacco: Never Used  . Alcohol use Yes     Comment: wine cooler x5 year  . Drug  use: No  . Sexual activity: Yes    Birth control/ protection: None     Comment: Pregnant    Other Topics Concern  . Not on file   Social History Narrative  . No narrative on file    Family History  Problem Relation Age of Onset  . Diabetes Father   . Hyperlipidemia Father   . Hypertension Father   . Kidney disease Maternal Uncle   . Arthritis Maternal Grandmother   . Cancer Paternal Grandmother   . Breast cancer Paternal Grandmother [id number]  . Breast cancer Other [id number]    The following portions of the patient's history were reviewed and updated as appropriate: allergies, current medications, past OB history, past medical history, past surgical history, past family history, past social history, and problem list.    OBJECTIVE: Initial Physical Exam (New OB)  GENERAL APPEARANCE: alert, well appearing, in no apparent distress, oriented to person, place and time HEAD: normocephalic, atraumatic MOUTH: mucous membranes moist, pharynx normal without lesions and dental hygiene good THYROID: no thyromegaly or masses present BREASTS: no  masses noted, no significant tenderness, no palpable axillary nodes, no skin changes LUNGS: clear to auscultation, no wheezes, rales or rhonchi, symmetric air entry HEART: regular rate and rhythm, no murmurs ABDOMEN: soft, nontender, nondistended, no abnormal masses, no epigastric pain, fundus not palpable and FHT present EXTREMITIES: no redness or tenderness in the calves or thighs SKIN: normal coloration and turgor, no rashes LYMPH NODES: no adenopathy palpable NEUROLOGIC: alert, oriented, normal speech, no focal findings or movement disorder noted  PELVIC EXAM not indicated Indications: Follow up Right ovarian cyst, fetal heart tones and measurement Findings:  Singleton intrauterine pregnancy is visualized with a CRL consistent with [redacted] weeks gestation, giving an (U/S) EDD of 10/12/17. The (U/S) EDD is consistent with the clinically established (LMP) EDD  of 10/16/17.  FHR: 157 BPM CRL measurement:  mm Yolk sac and and early anatomy appear WNL.  Right Ovary is enlarged and measures 7.9 x 3.5 x 5.4 cm. The large ovarian cyst seen on prior exam is smaller in size today measuring 4.5 x 3.6 x 4.1 cm ( prior: 5.9 x 4.9 x 4.7 cm). There is another cyst seen on today's exam measuring 2.2 x 1.6 x 1.8 cm. Both cysts appearing to be simple cysts. Vascular flow is noted in the ovarian tissue seen today. Left ovary was not visualized today. There is no obvious evidence of a corpus luteal cyst  Survey of the adnexa demonstrates no adnexal masses. There is no free peritoneal fluid in the cul de sac.  Impression: 1. 13 week Viable Singleton Intrauterine pregnancy by U/S. 2. (U/S) EDD is consistent with Clinically established (LMP) EDD of 10/16/17. 3. Enlarged right ovary with 2 simple appearing cysts noted. The large cyst seen on prior ultrasound has decreased in size, however, there is now another cyst seen. Vascular flow was seen in the remaining ovarian tissue.  ASSESSMENT: Normal pregnancy AMA Right ovarian cyst  PLAN: Prenatal care Redrew genetic screening- too early on first draw See orders

## 2017-04-06 NOTE — Patient Instructions (Signed)

## 2017-04-06 NOTE — Progress Notes (Signed)
NOB physical- pt is doing well, states she is getting some energy back, having some nausea

## 2017-04-17 ENCOUNTER — Encounter: Payer: Self-pay | Admitting: Obstetrics and Gynecology

## 2017-04-24 ENCOUNTER — Encounter: Payer: Self-pay | Admitting: Obstetrics and Gynecology

## 2017-05-04 ENCOUNTER — Encounter: Payer: Self-pay | Admitting: Certified Nurse Midwife

## 2017-05-04 ENCOUNTER — Ambulatory Visit (INDEPENDENT_AMBULATORY_CARE_PROVIDER_SITE_OTHER): Payer: BLUE CROSS/BLUE SHIELD | Admitting: Certified Nurse Midwife

## 2017-05-04 VITALS — BP 102/69 | HR 87 | Wt 131.8 lb

## 2017-05-04 DIAGNOSIS — O09522 Supervision of elderly multigravida, second trimester: Secondary | ICD-10-CM

## 2017-05-04 DIAGNOSIS — Z3492 Encounter for supervision of normal pregnancy, unspecified, second trimester: Secondary | ICD-10-CM

## 2017-05-04 LAB — POCT URINALYSIS DIPSTICK
Glucose, UA: NEGATIVE
KETONES UA: NEGATIVE
Nitrite, UA: NEGATIVE
PH UA: 7 (ref 5.0–8.0)
PROTEIN UA: NEGATIVE
RBC UA: NEGATIVE
Spec Grav, UA: 1.015 (ref 1.010–1.025)
UROBILINOGEN UA: 0.2 U/dL

## 2017-05-04 NOTE — Patient Instructions (Signed)
Varicose Veins Varicose veins are veins that have become enlarged and twisted. They are usually seen in the legs but can occur in other parts of the body as well. What are the causes? This condition is the result of valves in the veins not working properly. Valves in the veins help to return blood from the leg to the heart. If these valves are damaged, blood flows backward and backs up into the veins in the leg near the skin. This causes the veins to become larger. What increases the risk? People who are on their feet a lot, who are pregnant, or who are overweight are more likely to develop varicose veins. What are the signs or symptoms?  Bulging, twisted-appearing, bluish veins, most commonly found on the legs.  Leg pain or a feeling of heaviness. These symptoms may be worse at the end of the day.  Leg swelling.  Changes in skin color. How is this diagnosed? A health care provider can usually diagnose varicose veins by examining your legs. Your health care provider may also recommend an ultrasound of your leg veins. How is this treated? Most varicose veins can be treated at home.However, other treatments are available for people who have persistent symptoms or want to improve the cosmetic appearance of the varicose veins. These treatment options include:  Sclerotherapy. A solution is injected into the vein to close it off.  Laser treatment. A laser is used to heat the vein to close it off.  Radiofrequency vein ablation. An electrical current produced by radio waves is used to close off the vein.  Phlebectomy. The vein is surgically removed through small incisions made over the varicose vein.  Vein ligation and stripping. The vein is surgically removed through incisions made over the varicose vein after the vein has been tied (ligated). Follow these instructions at home:   Do not stand or sit in one position for long periods of time. Do not sit with your legs crossed. Rest with your  legs raised during the day.  Wear compression stockings as directed by your health care provider. These stockings help to prevent blood clots and reduce swelling in your legs.  Do not wear other tight, encircling garments around your legs, pelvis, or waist.  Walk as much as possible to increase blood flow.  Raise the foot of your bed at night with 2-inch blocks.  If you get a cut in the skin over the vein and the vein bleeds, lie down with your leg raised and press on it with a clean cloth until the bleeding stops. Then place a bandage (dressing) on the cut. See your health care provider if it continues to bleed. Contact a health care provider if:  The skin around your ankle starts to break down.  You have pain, redness, tenderness, or hard swelling in your leg over a vein.  You are uncomfortable because of leg pain. This information is not intended to replace advice given to you by your health care provider. Make sure you discuss any questions you have with your health care provider. Document Released: 07/19/2005 Document Revised: 03/16/2016 Document Reviewed: 04/11/2016 Elsevier Interactive Patient Education  2017 Elsevier Inc.  

## 2017-05-04 NOTE — Progress Notes (Signed)
[name]-Pt doing well, concerned about lack of Panorama results. Unable to find results in media tab. [facility] contacted by phone: unable to process sample one (1) to pregnancy less than nine (9) weeks and sample two (2) insufficient fetal DNA. Offered MateriT 21 plus draw today in office, pt agreeable. Discussed treatment measures for varicose veins including abdominal support, vaginal support and compression stockings. Reviewed red flag symptoms and when to call. RTC x 4 weeks for anatomy scan and [name].

## 2017-05-10 ENCOUNTER — Telehealth: Payer: Self-pay | Admitting: Certified Nurse Midwife

## 2017-05-10 LAB — MATERNIT 21 PLUS CORE, BLOOD
CHROMOSOME 18: NEGATIVE
Chromosome 13: NEGATIVE
Chromosome 21: NEGATIVE
Y CHROMOSOME: DETECTED

## 2017-05-10 NOTE — Telephone Encounter (Signed)
Telephone call to patient on identified line, verified full name and date of birth.   Cell free DNA testing results reviewed, low risk-female. Discussed accurarcy approximately 90-95%. Advised testing with each pregnancy since risks increase with maternal age.   Pt reports ankle soreness. Discussed release of relaxin hormone in pregnancy. Encouraged RICE and follow up if no relief with home measures.   Call back with further needs, questions, or concerns.    Gunnar BullaJenkins Michelle Shyasia Funches, [name]

## 2017-05-15 ENCOUNTER — Encounter: Payer: Self-pay | Admitting: Certified Nurse Midwife

## 2017-05-23 ENCOUNTER — Other Ambulatory Visit: Payer: Self-pay | Admitting: Certified Nurse Midwife

## 2017-05-23 DIAGNOSIS — Z369 Encounter for antenatal screening, unspecified: Secondary | ICD-10-CM

## 2017-06-01 ENCOUNTER — Encounter: Payer: BLUE CROSS/BLUE SHIELD | Admitting: Certified Nurse Midwife

## 2017-06-01 ENCOUNTER — Other Ambulatory Visit: Payer: BLUE CROSS/BLUE SHIELD

## 2017-06-14 ENCOUNTER — Encounter: Payer: Self-pay | Admitting: Certified Nurse Midwife

## 2017-06-14 ENCOUNTER — Ambulatory Visit (INDEPENDENT_AMBULATORY_CARE_PROVIDER_SITE_OTHER): Payer: BLUE CROSS/BLUE SHIELD

## 2017-06-14 ENCOUNTER — Ambulatory Visit (INDEPENDENT_AMBULATORY_CARE_PROVIDER_SITE_OTHER): Payer: BLUE CROSS/BLUE SHIELD | Admitting: Certified Nurse Midwife

## 2017-06-14 VITALS — BP 91/65 | HR 81 | Wt 135.4 lb

## 2017-06-14 DIAGNOSIS — Z369 Encounter for antenatal screening, unspecified: Secondary | ICD-10-CM

## 2017-06-14 DIAGNOSIS — N390 Urinary tract infection, site not specified: Secondary | ICD-10-CM

## 2017-06-14 DIAGNOSIS — Z3492 Encounter for supervision of normal pregnancy, unspecified, second trimester: Secondary | ICD-10-CM

## 2017-06-14 LAB — POCT URINALYSIS DIPSTICK
BILIRUBIN UA: NEGATIVE
GLUCOSE UA: NEGATIVE
Ketones, UA: NEGATIVE
Leukocytes, UA: NEGATIVE
Nitrite, UA: NEGATIVE
Protein, UA: NEGATIVE
RBC UA: NEGATIVE
SPEC GRAV UA: 1.015 (ref 1.010–1.025)
Urobilinogen, UA: 0.2 E.U./dL
pH, UA: 7.5 (ref 5.0–8.0)

## 2017-06-14 NOTE — Progress Notes (Signed)
[name]-Reports pelvic pressure, states "it feels like pubic bone is going to crack". Also, reports varicose veins to perineum and knees. Encouraged V2 supporter, abdominal support, and compression stockings. Patient also notes increased anxiety due to stressful situation with stepson. Currently, taking Celexa 40 mg daily with minimal effects and seeing a counselor weekly. Reviewed red flag symptoms and when to call. RTC x 4 weeks for [name].   [name] called to emergency in L&D. Advised patient would call with plan of care regarding medications tomorrow. Also, will send MyChart message with link for the best compression stockings for pregnancy.   ULTRASOUND REPORT  Location: [facility] Date of Service: 06/14/17  Indications:Anatomy U/S Findings:  Mason Jim intrauterine pregnancy is visualized with FHR at 152 BPM. Biometrics give an (U/S) Gestational age of 50 3/7 weeks and an (U/S) EDD of 10/15/17; this correlates with the clinically established EDD of 10/16/17.  Fetal presentation is Vertex.  EFW: 501g (1lb 2oz) Williams 40th percentile. Placenta: Posterior, grade 0, 5.2 cm from internal os. AFI: 4.9cm.  Anatomic survey is complete and normal; Gender - female  .   Right Ovary measures 6.2 x 3.9 x 4.7 cm. There is a simple cyst measuring 5.2 x 3.6 x 3.2 cm. Left Ovary is not seen. Survey of the adnexa demonstrates no adnexal masses. There is no free peritoneal fluid in the cul de sac.  Impression: 1.  22 3/7 week Viable Singleton Intrauterine pregnancy by U/S. 2. (U/S) EDD is consistent with Clinically established (LMP) EDD of 10/16/17. 3. Normal Anatomy Scan 4. Simple right ovarian cyst  Recommendations: 1.Clinical correlation with the patient's History and Physical Exam.

## 2017-06-14 NOTE — Patient Instructions (Signed)
Second Trimester of Pregnancy The second trimester is from week 13 through week 28, month 4 through 6. This is often the time in pregnancy that you feel your best. Often times, morning sickness has lessened or quit. You may have more energy, and you may get hungry more often. Your unborn baby (fetus) is growing rapidly. At the end of the sixth month, he or she is about 9 inches long and weighs about 1 pounds. You will likely feel the baby move (quickening) between 18 and 20 weeks of pregnancy. Follow these instructions at home:  Avoid all smoking, herbs, and alcohol. Avoid drugs not approved by your doctor.  Do not use any tobacco products, including cigarettes, chewing tobacco, and electronic cigarettes. If you need help quitting, ask your doctor. You may get counseling or other support to help you quit.  Only take medicine as told by your doctor. Some medicines are safe and some are not during pregnancy.  Exercise only as told by your doctor. Stop exercising if you start having cramps.  Eat regular, healthy meals.  Wear a good support bra if your breasts are tender.  Do not use hot tubs, steam rooms, or saunas.  Wear your seat belt when driving.  Avoid raw meat, uncooked cheese, and liter boxes and soil used by cats.  Take your prenatal vitamins.  Take 1500-2000 milligrams of calcium daily starting at the 20th week of pregnancy until you deliver your baby.  Try taking medicine that helps you poop (stool softener) as needed, and if your doctor approves. Eat more fiber by eating fresh fruit, vegetables, and whole grains. Drink enough fluids to keep your pee (urine) clear or pale yellow.  Take warm water baths (sitz baths) to soothe pain or discomfort caused by hemorrhoids. Use hemorrhoid cream if your doctor approves.  If you have puffy, bulging veins (varicose veins), wear support hose. Raise (elevate) your feet for 15 minutes, 3-4 times a day. Limit salt in your diet.  Avoid heavy  lifting, wear low heals, and sit up straight.  Rest with your legs raised if you have leg cramps or low back pain.  Visit your dentist if you have not gone during your pregnancy. Use a soft toothbrush to brush your teeth. Be gentle when you floss.  You can have sex (intercourse) unless your doctor tells you not to.  Go to your doctor visits. Get help if:  You feel dizzy.  You have mild cramps or pressure in your lower belly (abdomen).  You have a nagging pain in your belly area.  You continue to feel sick to your stomach (nauseous), throw up (vomit), or have watery poop (diarrhea).  You have bad smelling fluid coming from your vagina.  You have pain with peeing (urination). Get help right away if:  You have a fever.  You are leaking fluid from your vagina.  You have spotting or bleeding from your vagina.  You have severe belly cramping or pain.  You lose or gain weight rapidly.  You have trouble catching your breath and have chest pain.  You notice sudden or extreme puffiness (swelling) of your face, hands, ankles, feet, or legs.  You have not felt the baby move in over an hour.  You have severe headaches that do not go away with medicine.  You have vision changes. This information is not intended to replace advice given to you by your health care provider. Make sure you discuss any questions you have with your health care   provider. Document Released: 01/03/2010 Document Revised: 03/16/2016 Document Reviewed: 12/10/2012 Elsevier Interactive Patient Education  2017 Elsevier Inc.  

## 2017-06-15 ENCOUNTER — Telehealth: Payer: Self-pay

## 2017-06-15 DIAGNOSIS — F419 Anxiety disorder, unspecified: Secondary | ICD-10-CM

## 2017-06-15 MED ORDER — BUPROPION HCL ER (XL) 150 MG PO TB24: 150.0000 mg | ORAL_TABLET | Freq: Every day | ORAL | 0 refills | Status: DC

## 2017-06-15 NOTE — Telephone Encounter (Signed)
Called patient to advise that she is already on the max Celexa dose of 40mg  daily (per [place]). However, we can add Wellbutrin 150mg  daily to help with her symptoms. Patient agreed to treatment plan, and medication was sent into her pharmacy.  2 week F/U was also scheduled.  It was also recommended that she try counseling at [facility] in [place], and she reports that she already sees a counselor once weekly.

## 2017-06-17 ENCOUNTER — Encounter: Payer: Self-pay | Admitting: Certified Nurse Midwife

## 2017-06-17 DIAGNOSIS — N390 Urinary tract infection, site not specified: Secondary | ICD-10-CM | POA: Insufficient documentation

## 2017-06-29 ENCOUNTER — Ambulatory Visit (INDEPENDENT_AMBULATORY_CARE_PROVIDER_SITE_OTHER): Payer: BLUE CROSS/BLUE SHIELD | Admitting: Certified Nurse Midwife

## 2017-06-29 VITALS — BP 95/64 | HR 94 | Wt 133.9 lb

## 2017-06-29 DIAGNOSIS — Z3492 Encounter for supervision of normal pregnancy, unspecified, second trimester: Secondary | ICD-10-CM

## 2017-06-29 DIAGNOSIS — F419 Anxiety disorder, unspecified: Secondary | ICD-10-CM

## 2017-06-29 LAB — POCT URINALYSIS DIPSTICK
BILIRUBIN UA: NEGATIVE
Glucose, UA: NEGATIVE
KETONES UA: NEGATIVE
LEUKOCYTES UA: NEGATIVE
Nitrite, UA: NEGATIVE
PH UA: 7 (ref 5.0–8.0)
Protein, UA: NEGATIVE
RBC UA: NEGATIVE
SPEC GRAV UA: 1.01 (ref 1.010–1.025)
Urobilinogen, UA: 0.2 E.U./dL

## 2017-06-29 NOTE — Progress Notes (Signed)
[name]- pt is doing well, states her medication is working, has a sinus infection is taking meds for that

## 2017-06-29 NOTE — Progress Notes (Signed)
Medication check-Pt feeling better since starting Wellbutrin 150 mg daily in addition to Celexa 40 mg two (2) weeks ago due to increased anxiety in pregnancy. Denies SI/HI. Rx: Wellbutrin, see orders. Discussed medication use in pregnancy and risk associated with unmanaged anxiety/depression in pregnancy. Questions answered regarding pregnancy spacing and risks associated with AMA. Reviewed red flag symptoms and when to call. RTC as previously scheduled for [name] with Pattricia BossAnnie.   [facility]  Decreased Interest 0  Down, Depressed, Hopeless 0  PHQ - 2 Score 0  Altered sleeping 0  Tired, decreased energy 0  Change in appetite 0  Feeling bad or failure about yourself  0  Trouble concentrating 0  Moving slowly or fidgety/restless 0  Suicidal thoughts 0  PHQ-9 Score 0

## 2017-06-30 MED ORDER — BUPROPION HCL ER (XL) 150 MG PO TB24: 150.0000 mg | ORAL_TABLET | Freq: Every day | ORAL | 4 refills | Status: DC

## 2017-07-11 ENCOUNTER — Encounter: Payer: Self-pay | Admitting: Certified Nurse Midwife

## 2017-07-11 ENCOUNTER — Ambulatory Visit (INDEPENDENT_AMBULATORY_CARE_PROVIDER_SITE_OTHER): Payer: BLUE CROSS/BLUE SHIELD | Admitting: Certified Nurse Midwife

## 2017-07-11 VITALS — BP 95/64 | HR 85 | Wt 138.4 lb

## 2017-07-11 DIAGNOSIS — Z3492 Encounter for supervision of normal pregnancy, unspecified, second trimester: Secondary | ICD-10-CM

## 2017-07-11 DIAGNOSIS — Z23 Encounter for immunization: Secondary | ICD-10-CM | POA: Diagnosis not present

## 2017-07-11 LAB — POCT URINALYSIS DIPSTICK
BILIRUBIN UA: NEGATIVE
Blood, UA: NEGATIVE
GLUCOSE UA: NEGATIVE
Ketones, UA: NEGATIVE
LEUKOCYTES UA: NEGATIVE
NITRITE UA: NEGATIVE
Protein, UA: NEGATIVE
Spec Grav, UA: 1.01 (ref 1.010–1.025)
UROBILINOGEN UA: 0.2 U/dL
pH, UA: 7.5 (ref 5.0–8.0)

## 2017-07-11 NOTE — Progress Notes (Signed)
[name]-varicose veins getting worse, has not picked up V2 supporter. Also has hemorrhoid that is flaring up. OK to use PrepH.Flu vaccine` given

## 2017-07-11 NOTE — Addendum Note (Signed)
Addended by: Brooke Dare on: 07/11/2017 10:10 AM   Modules accepted: Orders

## 2017-07-11 NOTE — Progress Notes (Signed)
Pt is here for a routine OB visit. 

## 2017-07-13 ENCOUNTER — Telehealth: Payer: Self-pay | Admitting: Certified Nurse Midwife

## 2017-07-13 NOTE — Telephone Encounter (Signed)
Pt states she completed course of amoxicillin for a uri last week. Yesterday she woke up with vaginal itching and clear d/c. NO meds tried. NO new soaps, detergents or body washes. Advised 7 day monistat. If sx no better on day 9-10 she will need to be seen. Pt voices understanding.

## 2017-07-13 NOTE — Telephone Encounter (Signed)
Patient called and stated that's she just finished some antibiotics and now the patient is absolutely sure that she has a yeast infection. The patient would like to speak with a nurse to discuss what she needs to do next in order to get a prescription soon to get relief. No other information was disclosed. Please advise.

## 2017-07-16 ENCOUNTER — Other Ambulatory Visit: Payer: Self-pay

## 2017-07-16 DIAGNOSIS — F419 Anxiety disorder, unspecified: Secondary | ICD-10-CM

## 2017-07-16 MED ORDER — BUPROPION HCL ER (XL) 150 MG PO TB24: 150.0000 mg | ORAL_TABLET | Freq: Every day | ORAL | 1 refills | Status: AC

## 2017-07-26 ENCOUNTER — Other Ambulatory Visit: Payer: BLUE CROSS/BLUE SHIELD

## 2017-07-26 ENCOUNTER — Encounter: Payer: Self-pay | Admitting: Certified Nurse Midwife

## 2017-07-26 ENCOUNTER — Ambulatory Visit (INDEPENDENT_AMBULATORY_CARE_PROVIDER_SITE_OTHER): Payer: BLUE CROSS/BLUE SHIELD | Admitting: Certified Nurse Midwife

## 2017-07-26 VITALS — BP 106/68 | HR 88 | Wt 140.9 lb

## 2017-07-26 DIAGNOSIS — Z6791 Unspecified blood type, Rh negative: Secondary | ICD-10-CM | POA: Diagnosis not present

## 2017-07-26 DIAGNOSIS — O99619 Diseases of the digestive system complicating pregnancy, unspecified trimester: Secondary | ICD-10-CM

## 2017-07-26 DIAGNOSIS — Z3492 Encounter for supervision of normal pregnancy, unspecified, second trimester: Secondary | ICD-10-CM

## 2017-07-26 DIAGNOSIS — O09899 Supervision of other high risk pregnancies, unspecified trimester: Secondary | ICD-10-CM

## 2017-07-26 DIAGNOSIS — Z130 Encounter for screening for diseases of the blood and blood-forming organs and certain disorders involving the immune mechanism: Secondary | ICD-10-CM

## 2017-07-26 DIAGNOSIS — N390 Urinary tract infection, site not specified: Secondary | ICD-10-CM

## 2017-07-26 DIAGNOSIS — O2243 Hemorrhoids in pregnancy, third trimester: Secondary | ICD-10-CM

## 2017-07-26 DIAGNOSIS — Z23 Encounter for immunization: Secondary | ICD-10-CM

## 2017-07-26 DIAGNOSIS — K219 Gastro-esophageal reflux disease without esophagitis: Secondary | ICD-10-CM

## 2017-07-26 DIAGNOSIS — O26899 Other specified pregnancy related conditions, unspecified trimester: Secondary | ICD-10-CM

## 2017-07-26 DIAGNOSIS — Z131 Encounter for screening for diabetes mellitus: Secondary | ICD-10-CM

## 2017-07-26 LAB — POCT URINALYSIS DIPSTICK
Bilirubin, UA: NEGATIVE
GLUCOSE UA: NEGATIVE
KETONES UA: NEGATIVE
LEUKOCYTES UA: NEGATIVE
NITRITE UA: NEGATIVE
Protein, UA: NEGATIVE
RBC UA: NEGATIVE
SPEC GRAV UA: 1.02 (ref 1.010–1.025)
Urobilinogen, UA: 0.2 E.U./dL
pH, UA: 6 (ref 5.0–8.0)

## 2017-07-26 MED ORDER — RHO D IMMUNE GLOBULIN 1500 UNITS IM SOSY
1500.0000 [IU] | PREFILLED_SYRINGE | Freq: Once | INTRAMUSCULAR | Status: AC
  Administered 2017-07-26: 1500 [IU] via INTRAMUSCULAR

## 2017-07-26 NOTE — Patient Instructions (Signed)
Doren Custard Class  April 04, 2017  Wednesday 7:00p - 9:00p  [place], [place]  May 09, 2017  Wednesday [place], [place]    June 13, 2017   Wednesday [place], [place]  July 11, 2017  Wednesday  [place], [place]  August 08, 2017 Wednesday [place], [place]  Interested in a waterbirth?  This informational class will help you discover whether waterbirth is the right fit for you.  Education about waterbirth itself, supplies you would need and how to assemble your support team is what you can expect from this class.  Some obstetrical practices require this class in order to pursue a waterbirth.  (Not all obstetrical practices offer waterbirth check with your healthcare provider)  Register only the expectant mom, but you are encouraged to bring your partner to class!  Fees & Payment No fee  Register Online [email or web address] Search [place] 2018 Prenatal Education Class Schedule Register at [email or web address] in the [place] or call Kerman Passey at [phone] 9:00a-5:00p M-F  Childbirth Preparation Certified Childbirth Educators teach this 5 week course.  Expectant parents are encouraged to take this class in their 3rd trimester, completing it by their 35-36th week. Meets in [facility], Lower Level.  Mondays Thursdays  7:00-9:00 p 7:00-9:00 p  July 23 - August 20 July 19 - August 16  September 17 - October 15 September 6 -October 4  November 5 - December 3 October 25 - November 29   No Class on Thanksgiving Day -November 22  Childbirth Preparation Refresher Course For those who have previously attended Prepared Childbirth Preparation classes, this class in incorporated into the 3rd and 4th  classes in the Monday night childbirth series.  Course meets in the [facility] Lower Level from 7:00p - 9:00p  August 6 & 13  October 1 & 8  November 19 & 26   Weekend Childbirth Waymon Amato Classes are held Saturday & Sunday, 1:00 5:00p Course meets in [facility] [place] Level  August 4 & 5  November 3 & 4    The BirthPlace Tours Free tours are held on the third Sunday of each month at 3 pm.  The tour meets in the third floor waiting area and will take approximately 30 minutes.  Tours are also included in Childbirth class series as well as Brother/Sister class.  An online virtual tour can be seen at [email or web address].         Breastfeeding & Infant Nutrition The course incorporates returning to work or school.  Breast milk collection and storage with basic breastfeeding and infant nutrition. This two-class course is held the 2nd and 3rd Tuesday of each month from 7:00 -9:00 pm.  Course meets in the [place] [facility] Lower level  June 12 & 19 July-No Class  August 14 & 21 September 11 &18  September 11 & 18 October 9 &16  November 13 & 20 December 11 & 18   [employer] welcomes any mother for a social outing with other Moms to share experiences and challenges in an informal setting.  Meets the 1st Thursday and 3rd Thursday 11:30a-1:00 pm of each month in [facility] 3rd floor classroom.  No registration required.  Newborn Essentials This course covers bathing, diapering, swaddling and more with practice on lifelike dolls.  Participants  will also learn safety tips and infant CPR (Not for certification).  It is held the 1st Wednesday of each month from 7:00p-9:00p in the [facility], Lower level.  June 6 July- No Class  August 1 September 5  October 3 November 7  December 7    Preparing Big Brother & Sister This one session course prepares children and their parents for the arrival of a new baby.  It is held on the 1st Tuesday of  each month from 6:30p - 8:00p. Course meets in the [facility], Lower level.  July-No Class August 7  September 4 October 2  November 6 December 4   [place] [facility] [employer] This nationally acclaimed class helps expecting and new dads with the basic skills and confidence to bond with their infants, support their mates, and provide a safe and healthy home environment for their new family. Classes are held the 2nd Saturday of every month from 9:00a - 12:00 noon.  Course meets in the [facility] Lower level.  June 9 August 11  October 13 No Class in December  Third Trimester of Pregnancy The third trimester is from week 28 through week 40 (months 7 through 9). The third trimester is a time when the unborn baby (fetus) is growing rapidly. At the end of the ninth month, the fetus is about 20 inches in length and weighs 6-10 pounds. Body changes during your third trimester Your body will continue to go through many changes during pregnancy. The changes vary from woman to woman. During the third trimester:  Your weight will continue to increase. You can expect to gain 25-35 pounds (11-16 kg) by the end of the pregnancy.  You may begin to get stretch marks on your hips, abdomen, and breasts.  You may urinate more often because the fetus is moving lower into your pelvis and pressing on your bladder.  You may develop or continue to have heartburn. This is caused by increased hormones that slow down muscles in the digestive tract.  You may develop or continue to have constipation because increased hormones slow digestion and cause the muscles that push waste through your intestines to relax.  You may develop hemorrhoids. These are swollen veins (varicose veins) in the rectum that can itch or be painful.  You may develop swollen, bulging veins (varicose veins) in your legs.  You may have increased body aches in the pelvis, back, or thighs. This is due to weight gain and increased  hormones that are relaxing your joints.  You may have changes in your hair. These can include thickening of your hair, rapid growth, and changes in texture. Some women also have hair loss during or after pregnancy, or hair that feels dry or thin. Your hair will most likely return to normal after your baby is born.  Your breasts will continue to grow and they will continue to become tender. A yellow fluid (colostrum) may leak from your breasts. This is the first milk you are producing for your baby.  Your belly button may stick out.  You may notice more swelling in your hands, face, or ankles.  You may have increased tingling or numbness in your hands, arms, and legs. The skin on your belly may also feel numb.  You may feel short of breath because of your expanding uterus.  You may have more problems sleeping. This can be caused by the size of your belly, increased need to urinate, and an increase in  your body's metabolism.  You may notice the fetus "dropping," or moving lower in your abdomen (lightening).  You may have increased vaginal discharge.  You may notice your joints feel loose and you may have pain around your pelvic bone.  What to expect at prenatal visits You will have prenatal exams every 2 weeks until week 36. Then you will have weekly prenatal exams. During a routine prenatal visit:  You will be weighed to make sure you and the baby are growing normally.  Your blood pressure will be taken.  Your abdomen will be measured to track your baby's growth.  The fetal heartbeat will be listened to.  Any test results from the previous visit will be discussed.  You may have a cervical check near your due date to see if your cervix has softened or thinned (effaced).  You will be tested for Group B streptococcus. This happens between 35 and 37 weeks.  Your health care provider may ask you:  What your birth plan is.  How you are feeling.  If you are feeling the baby  move.  If you have had any abnormal symptoms, such as leaking fluid, bleeding, severe headaches, or abdominal cramping.  If you are using any tobacco products, including cigarettes, chewing tobacco, and electronic cigarettes.  If you have any questions.  Other tests or screenings that may be performed during your third trimester include:  Blood tests that check for low iron levels (anemia).  Fetal testing to check the health, activity level, and growth of the fetus. Testing is done if you have certain medical conditions or if there are problems during the pregnancy.  Nonstress test (NST). This test checks the health of your baby to make sure there are no signs of problems, such as the baby not getting enough oxygen. During this test, a belt is placed around your belly. The baby is made to move, and its heart rate is monitored during movement.  What is false labor? False labor is a condition in which you feel small, irregular tightenings of the muscles in the womb (contractions) that usually go away with rest, changing position, or drinking water. These are called Braxton Hicks contractions. Contractions may last for hours, days, or even weeks before true labor sets in. If contractions come at regular intervals, become more frequent, increase in intensity, or become painful, you should see your health care provider. What are the signs of labor?  Abdominal cramps.  Regular contractions that start at 10 minutes apart and become stronger and more frequent with time.  Contractions that start on the top of the uterus and spread down to the lower abdomen and back.  Increased pelvic pressure and dull back pain.  A watery or bloody mucus discharge that comes from the vagina.  Leaking of amniotic fluid. This is also known as your "water breaking." It could be a slow trickle or a gush. Let your health care provider know if it has a color or strange odor. If you have any of these signs, call your  health care provider right away, even if it is before your due date. Follow these instructions at home: Medicines  Follow your health care provider's instructions regarding medicine use. Specific medicines may be either safe or unsafe to take during pregnancy.  Take a prenatal vitamin that contains at least 600 micrograms (mcg) of folic acid.  If you develop constipation, try taking a stool softener if your health care provider approves. Eating and drinking  Eat  a balanced diet that includes fresh fruits and vegetables, whole grains, good sources of protein such as meat, eggs, or tofu, and low-fat dairy. Your health care provider will help you determine the amount of weight gain that is right for you.  Avoid raw meat and uncooked cheese. These carry germs that can cause birth defects in the baby.  If you have low calcium intake from food, talk to your health care provider about whether you should take a daily calcium supplement.  Eat four or five small meals rather than three large meals a day.  Limit foods that are high in fat and processed sugars, such as fried and sweet foods.  To prevent constipation: ? Drink enough fluid to keep your urine clear or pale yellow. ? Eat foods that are high in fiber, such as fresh fruits and vegetables, whole grains, and beans. Activity  Exercise only as directed by your health care provider. Most women can continue their usual exercise routine during pregnancy. Try to exercise for 30 minutes at least 5 days a week. Stop exercising if you experience uterine contractions.  Avoid heavy lifting.  Do not exercise in extreme heat or humidity, or at high altitudes.  Wear low-heel, comfortable shoes.  Practice good posture.  You may continue to have sex unless your health care provider tells you otherwise. Relieving pain and discomfort  Take frequent breaks and rest with your legs elevated if you have leg cramps or low back pain.  Take warm sitz  baths to soothe any pain or discomfort caused by hemorrhoids. Use hemorrhoid cream if your health care provider approves.  Wear a good support bra to prevent discomfort from breast tenderness.  If you develop varicose veins: ? Wear support pantyhose or compression stockings as told by your healthcare provider. ? Elevate your feet for 15 minutes, 3-4 times a day. Prenatal care  Write down your questions. Take them to your prenatal visits.  Keep all your prenatal visits as told by your health care provider. This is important. Safety  Wear your seat belt at all times when driving.  Make a list of emergency phone numbers, including numbers for family, friends, the hospital, and police and fire departments. General instructions  Avoid cat litter boxes and soil used by cats. These carry germs that can cause birth defects in the baby. If you have a cat, ask someone to clean the litter box for you.  Do not travel far distances unless it is absolutely necessary and only with the approval of your health care provider.  Do not use hot tubs, steam rooms, or saunas.  Do not drink alcohol.  Do not use any products that contain nicotine or tobacco, such as cigarettes and e-cigarettes. If you need help quitting, ask your health care provider.  Do not use any medicinal herbs or unprescribed drugs. These chemicals affect the formation and growth of the baby.  Do not douche or use tampons or scented sanitary pads.  Do not cross your legs for long periods of time.  To prepare for the arrival of your baby: ? Take prenatal classes to understand, practice, and ask questions about labor and delivery. ? Make a trial run to the hospital. ? Visit the hospital and tour the maternity area. ? Arrange for maternity or paternity leave through employers. ? Arrange for family and friends to take care of pets while you are in the hospital. ? Purchase a rear-facing car seat and make sure you know how to install  it in your car. ? Pack your hospital bag. ? Prepare the baby's nursery. Make sure to remove all pillows and stuffed animals from the baby's crib to prevent suffocation.  Visit your dentist if you have not gone during your pregnancy. Use a soft toothbrush to brush your teeth and be gentle when you floss. Contact a health care provider if:  You are unsure if you are in labor or if your water has broken.  You become dizzy.  You have mild pelvic cramps, pelvic pressure, or nagging pain in your abdominal area.  You have lower back pain.  You have persistent nausea, vomiting, or diarrhea.  You have an unusual or bad smelling vaginal discharge.  You have pain when you urinate. Get help right away if:  Your water breaks before 37 weeks.  You have regular contractions less than 5 minutes apart before 37 weeks.  You have a fever.  You are leaking fluid from your vagina.  You have spotting or bleeding from your vagina.  You have severe abdominal pain or cramping.  You have rapid weight loss or weight gain.  You have shortness of breath with chest pain.  You notice sudden or extreme swelling of your face, hands, ankles, feet, or legs.  Your baby makes fewer than 10 movements in 2 hours.  You have severe headaches that do not go away when you take medicine.  You have vision changes. Summary  The third trimester is from week 28 through week 40, months 7 through 9. The third trimester is a time when the unborn baby (fetus) is growing rapidly.  During the third trimester, your discomfort may increase as you and your baby continue to gain weight. You may have abdominal, leg, and back pain, sleeping problems, and an increased need to urinate.  During the third trimester your breasts will keep growing and they will continue to become tender. A yellow fluid (colostrum) may leak from your breasts. This is the first milk you are producing for your baby.  False labor is a condition in  which you feel small, irregular tightenings of the muscles in the womb (contractions) that eventually go away. These are called Braxton Hicks contractions. Contractions may last for hours, days, or even weeks before true labor sets in.  Signs of labor can include: abdominal cramps; regular contractions that start at 10 minutes apart and become stronger and more frequent with time; watery or bloody mucus discharge that comes from the vagina; increased pelvic pressure and dull back pain; and leaking of amniotic fluid. This information is not intended to replace advice given to you by your health care provider. Make sure you discuss any questions you have with your health care provider. Document Released: 10/03/2001 Document Revised: 03/16/2016 Document Reviewed: 12/10/2012 Elsevier Interactive Patient Education  2017 [employer].

## 2017-07-27 LAB — CBC WITH DIFFERENTIAL/PLATELET
Basophils Absolute: 0 10*3/uL (ref 0.0–0.2)
Basos: 0 %
EOS (ABSOLUTE): 0.1 10*3/uL (ref 0.0–0.4)
Eos: 1 %
Hematocrit: 36.9 % (ref 34.0–46.6)
Hemoglobin: 12.1 g/dL (ref 11.1–15.9)
IMMATURE GRANULOCYTES: 1 %
Immature Grans (Abs): 0.1 10*3/uL (ref 0.0–0.1)
Lymphocytes Absolute: 1.8 10*3/uL (ref 0.7–3.1)
Lymphs: 17 %
MCH: 30 pg (ref 26.6–33.0)
MCHC: 32.8 g/dL (ref 31.5–35.7)
MCV: 91 fL (ref 79–97)
MONOS ABS: 0.7 10*3/uL (ref 0.1–0.9)
Monocytes: 6 %
NEUTROS ABS: 7.9 10*3/uL — AB (ref 1.4–7.0)
NEUTROS PCT: 75 %
PLATELETS: 212 10*3/uL (ref 150–379)
RBC: 4.04 x10E6/uL (ref 3.77–5.28)
RDW: 13.9 % (ref 12.3–15.4)
WBC: 10.5 10*3/uL (ref 3.4–10.8)

## 2017-07-27 LAB — GLUCOSE, 1 HOUR GESTATIONAL: Gestational Diabetes Screen: 95 mg/dL (ref 65–139)

## 2017-07-28 ENCOUNTER — Encounter: Payer: Self-pay | Admitting: Certified Nurse Midwife

## 2017-07-28 DIAGNOSIS — K219 Gastro-esophageal reflux disease without esophagitis: Secondary | ICD-10-CM

## 2017-07-28 DIAGNOSIS — O99619 Diseases of the digestive system complicating pregnancy, unspecified trimester: Secondary | ICD-10-CM

## 2017-07-28 DIAGNOSIS — O2243 Hemorrhoids in pregnancy, third trimester: Secondary | ICD-10-CM | POA: Insufficient documentation

## 2017-07-28 HISTORY — DX: Gastro-esophageal reflux disease without esophagitis: K21.9

## 2017-07-28 LAB — URINE CULTURE: Organism ID, Bacteria: NO GROWTH

## 2017-07-28 MED ORDER — RANITIDINE HCL 150 MG PO CAPS: 150.0000 mg | ORAL_CAPSULE | Freq: Two times a day (BID) | ORAL | 3 refills | Status: DC

## 2017-07-28 MED ORDER — DOCUSATE SODIUM 100 MG PO CAPS: 100.0000 mg | ORAL_CAPSULE | Freq: Two times a day (BID) | ORAL | 2 refills | Status: DC | PRN

## 2017-07-28 NOTE — Progress Notes (Signed)
[name]-Pt doing well, increased GERD and hemorrhoids noted. Rx: Zantac and colace, see orders. Discuss home treatment measures. Reports no change to varicose veins at this time, V2 supporter/abdominal support ordered, and breast pump delivered. Glucola, CBC, Rhogam, and TDaP today. Blood transfusion consent reviewed and signed. Education regarding intrapartum pain management options, information given for hydrotherapy tub rental, and course of prenatal care. Reviewed red flag symptoms and when to call. RTC x 2 weeks for [name] or sooner if needed.

## 2017-08-05 ENCOUNTER — Emergency Department
Admission: EM | Admit: 2017-08-05 | Discharge: 2017-08-05 | Disposition: A | Discharge status: Home / Self Care | Payer: BLUE CROSS/BLUE SHIELD | Attending: Emergency Medicine | Admitting: Emergency Medicine

## 2017-08-05 ENCOUNTER — Encounter: Payer: Self-pay | Admitting: *Deleted

## 2017-08-05 DIAGNOSIS — K645 Perianal venous thrombosis: Secondary | ICD-10-CM | POA: Insufficient documentation

## 2017-08-05 DIAGNOSIS — J45909 Unspecified asthma, uncomplicated: Secondary | ICD-10-CM | POA: Diagnosis not present

## 2017-08-05 DIAGNOSIS — E119 Type 2 diabetes mellitus without complications: Secondary | ICD-10-CM | POA: Diagnosis not present

## 2017-08-05 DIAGNOSIS — Z79899 Other long term (current) drug therapy: Secondary | ICD-10-CM | POA: Diagnosis not present

## 2017-08-05 DIAGNOSIS — K649 Unspecified hemorrhoids: Secondary | ICD-10-CM | POA: Diagnosis present

## 2017-08-05 MED ORDER — LIDOCAINE HCL (PF) 1 % IJ SOLN
INTRAMUSCULAR | Status: AC
  Filled 2017-08-05: qty 5

## 2017-08-05 MED ORDER — POLYETHYLENE GLYCOL 3350 17 GM/SCOOP PO POWD: 17.0000 g | Freq: Every day | ORAL | 0 refills | Status: DC

## 2017-08-05 NOTE — Procedures (Signed)
Procedure: Incision and drainage of thrombosed external hemorroid  Surgeon: Dr. Hazle Quant  Anesthesia: Loca  Wound classification: Clean Contaminated  Indications: This 41 year old female was found to have a thrombosed external hemorrhoid with 2 days of onset on acute pain.   Findings: 1. Multiple closed drainged from left colum external hemorrhoid 2. Adequate hemostasis  Description of procedure: On the ED department with the presence of a female nurse, the patient was placed in the left lateral decubitus position. A time-out was completed verifying correct patient, procedure, site, positioning, and/or special equipment prior to beginning this procedure.  The perianal was prepped standard sterile fashion. Local anesthetic was injected as a perianal block on the left side. An incision was made over the most tense area of the hemorrhoid. Multiple clots were drained until no more clots were identified. A gauze pad was tucked between the gluteal folds.  The patient tolerated the procedure well.   Specimen: none  Complications: none

## 2017-08-05 NOTE — ED Triage Notes (Signed)
PT to ED reporting having a hemorrhoid since Friday. Pt has tried witch hazel, proctozine, ice, stool softners, and warm baths without success. Pt has a hx of hemorrhoids and having had one lanced 15 years ago. No blood in stool noted at this time.

## 2017-08-05 NOTE — Consult Note (Signed)
SURGICAL CONSULTATION NOTE   HISTORY OF PRESENT ILLNESS (HPI):  41 y.o. female presented to [facility] for evaluation of external hemorrhoids. Patient who is [redacted] weeks pregnant, presents with acute pain on the perianal area with a palpable hemorrhoid of 2 days of onset. Patient tried warm sitz bath, anesthetic cream without any pain. Pain does no radiates to other area. Nothing improves the pain. Pain is getting worse with time. Palpation aggravates the pain. Denies fever, bleeding, secretions. Refers having constipation during pregnancy. Patient had the same problem during the previous two pregnancies.   Surgery is consulted by Dr. Lenard Lance in this context for evaluation and management of thrombosed external hemorrhoid. [facility]  PAST MEDICAL HISTORY (PMH):  Past Medical History:  Diagnosis Date  . Anxiety   . Arthritis   . Asthma   . Depression   . Diabetes mellitus without complication (HCC)   . Gastroesophageal reflux in pregnancy 07/28/2017     PAST SURGICAL HISTORY [facility]):  Past Surgical History:  Procedure Laterality Date  . BUNIONECTOMY Left 1992 and 1998   both left and right     MEDICATIONS:  Prior to Admission medications   Medication Sig Start Date End Date Taking? Authorizing Provider  albuterol (PROVENTIL HFA;VENTOLIN HFA) 108 (90 Base) MCG/ACT inhaler Inhale into the lungs every 6 (six) hours as needed for wheezing or shortness of breath.   Yes [provider]  buPROPion (WELLBUTRIN XL) 150 MG 24 hr tablet Take 1 tablet (150 mg total) by mouth daily. 07/16/17  Yes [name], [name], [name]  citalopram (CELEXA) 40 MG tablet Take 1 tablet (40 mg total) by mouth daily. 08/07/16  Yes [name], [name]  docusate sodium (COLACE) 100 MG capsule Take 1 capsule (100 mg total) by mouth 2 (two) times daily as needed. 07/28/17  Yes [name], Vanessa [place], [name]  Prenatal-DSS-FeCb-FeGl-FA (CITRANATAL BLOOM) 90-1 MG TABS Take 90 mg by mouth daily. 03/22/17  Yes [name], Melody  N, [name]  ranitidine (ZANTAC) 150 MG capsule Take 1 capsule (150 mg total) by mouth 2 (two) times daily. 07/28/17  Yes [name], Vanessa Stark, [name]     ALLERGIES:  No Known Allergies   SOCIAL HISTORY:  Social History   Social History  . Marital status: Married    Spouse name: N/A  . Number of children: N/A  . Years of education: N/A   Occupational History  . Not on file.   Social History Main Topics  . Smoking status: Never Smoker  . Smokeless tobacco: Never Used  . Alcohol use No     Comment: wine cooler x5 year  . Drug use: No  . Sexual activity: Yes    Birth control/ protection: None     Comment: Pregnant    Other Topics Concern  . Not on file   Social History Narrative  . No narrative on file    The patient currently resides (home / rehab facility / nursing home): Home The patient normally is (ambulatory / bedbound): Ambulatory   FAMILY HISTORY:  Family History  Problem Relation Age of Onset  . Diabetes Father   . Hyperlipidemia Father   . Hypertension Father   . Kidney disease Maternal Uncle   . Arthritis Maternal Grandmother   . Cancer Paternal Grandmother   . Breast cancer Paternal Grandmother 54  . Breast cancer Other 65     REVIEW OF SYSTEMS:  Constitutional: denies weight loss, fever, chills, or sweats  Eyes: denies any other vision changes, history of eye  injury  ENT: denies sore throat, hearing problems  Respiratory: denies shortness of breath, wheezing  Cardiovascular: denies chest pain, palpitations  Gastrointestinal: denies abdominal pain, N/V, Positive for constipation and hemorrhoids Genitourinary: denies burning with urination or urinary frequency Musculoskeletal: denies any other joint pains or cramps  Skin: denies any other rashes or skin discolorations  Neurological: denies any other headache, dizziness, weakness  Psychiatric: denies any other depression, anxiety   All other review of systems were negative   VITAL SIGNS:  Temp:   [97.7 F (36.5 C)] 97.7 F (36.5 C) (10/14 1225) Pulse Rate:  [105] 105 (10/14 1225) Resp:  [16] 16 (10/14 1225) BP: (110)/(69) 110/69 (10/14 1225) SpO2:  [99 %] 99 % (10/14 1225) Weight:  [63.5 kg (140 lb)] 63.5 kg (140 lb) (10/14 1226)       Weight: 63.5 kg (140 lb)     PHYSICAL EXAM:  Constitutional:  -- Awake, alert, and oriented x3, in pain  Eyes:  -- Pupils equally round and reactive to light  -- No scleral icterus  Ear, nose, and throat:  -- No jugular venous distension  Pulmonary:  -- No crackles or wheezing -- Equal breath sounds bilaterally Cardiovascular:  -- S1, S2 present  -- No pericardial rubs Gastrointestinal:  -- Abdomen soft, nontender, non-distended, no guarding or rebound tenderness -- No abdominal masses appreciated, pulsatile or otherwise  Rectal --No palpable internal hemorrhoids, adequate sphincter tone --large thrombosed left column hemorrhoid. Tender to palpation Musculoskeletal and Integumentary:  -- Wounds or skin discoloration: None appreciated -- Extremities: no edema, no cyanosis  Neurologic:  -- Motor function: intact and symmetric -- Sensation: intact and symmetric   Labs:  CBC Latest Ref Rng & Units 07/26/2017 03/22/2017  WBC 3.4 - 10.8 x10E3/uL 10.5 8.9  Hemoglobin 11.1 - 15.9 g/dL 16.1 09.6  Hematocrit 04.5 - 46.6 % 36.9 39.5  Platelets 150 - 379 x10E3/uL 212 274   Imaging studies:  No images to review at this encounter  Assessment/Plan: (ICD-10's: K64.5) 41 y.o. female with G3P2A0, 30 WGA, that presents with an external hemorrhoid that started with symptoms 2 days ago. Patient currently in acute severe pain. The diagnosis of thrombosed hemorrhoid was explained to the patient, which has had this recurrent problem during the previous pregnancies that needed incision and drainage. Patient was oriented about the treatment alternatives of conservative management, incision and drainage or hemorrhoidectomy. After risks and benefits of the  alternatives patient consent for incision and drainage with the goal of having hemorrhoidectomy after pregnancy since this is a recurrent problem. Since the pain started two days ago, incision and drainage of clot may benefit patient with pain management. Patient was oriented that the definite treatment was excision of hemorrhoid.  Will perform Incision and drainage of thrombosed external hemorrhoid as consent by patient. See procedure note for detail of incision and drainage.   Recommendations after procedure 1. High fiber diet (Metamucil supplements) 2. Take 8 glasses of 8oz of water daily 3. Stool softener (patient already has Docusate) 4. Avoid sitting on toilet for a prolonged period. Just sit on the toilet when having the urge.  5. Sitz (warm) bath and cream may be used to symptoms relieve purposes 6. Follow up with surgeon for hemorrhoidectomy   All of the above findings and recommendations were discussed with the patient and her son, and all of patient's questions were answered to her expressed satisfaction.

## 2017-08-05 NOTE — ED Notes (Signed)
FIRST NURSE NOTE-pt here for hemorrhoids. Hx of surgery x 2 for same and feels like needs again.

## 2017-08-05 NOTE — ED Provider Notes (Signed)
[facility] Emergency Department Provider Note  Time seen: 1:08 PM  I have reviewed the triage vital signs and the nursing notes.   HISTORY  Chief Complaint Hemorrhoids    HPI [facility] is a 41 y.o. female With a past medical history of anxiety, diabetes, hemorrhoids, currently pregnant who presents to the emergency department with hemorrhoid pain. According to the patient for the past 3 days she has had significant hemorrhoid pain. Patient has been using topical hydrocortisone as well as sitz baths without relief. States the pain has become unbearable so she came to the emergency department for evaluation. States during her last pregnancy she required hemorrhoid to be lanced. Patient denies any fever. Denies any vomiting or denies any abdominal pain.  Past Medical History:  Diagnosis Date  . Anxiety   . Arthritis   . Asthma   . Depression   . Diabetes mellitus without complication (HCC)   . Gastroesophageal reflux in pregnancy 07/28/2017    Patient Active Problem List   Diagnosis Date Noted  . Gastroesophageal reflux in pregnancy 07/28/2017  . Hemorrhoids during pregnancy in third trimester 07/28/2017  . 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 Rh negative state in antepartum period 03/23/2017  . Generalized anxiety disorder 08/07/2016  . Depression 08/07/2016    Past Surgical History:  Procedure Laterality Date  . BUNIONECTOMY Left 1992 and 1998   both left and right    Prior to Admission medications   Medication Sig Start Date End Date Taking? Authorizing Provider  albuterol (PROVENTIL HFA;VENTOLIN HFA) 108 (90 Base) MCG/ACT inhaler Inhale into the lungs every 6 (six) hours as needed for wheezing or shortness of breath.    [provider]  amoxicillin (AMOXIL) 500 MG capsule Take 500 mg by mouth 2 (two) times daily.    [provider]  buPROPion (WELLBUTRIN XL) 150 MG 24 hr tablet Take 1 tablet (150 mg total) by mouth daily. 07/16/17    [name], Vanessa Malcolm, [name]  citalopram (CELEXA) 40 MG tablet Take 1 tablet (40 mg total) by mouth daily. 08/07/16   Dorena Bodo, [name]  docusate sodium (COLACE) 100 MG capsule Take 1 capsule (100 mg total) by mouth 2 (two) times daily as needed. 07/28/17   [name], Vanessa [place], [name]  Prenatal-DSS-FeCb-FeGl-FA (CITRANATAL BLOOM) 90-1 MG TABS Take 90 mg by mouth daily. 03/22/17   [name], [name], [name]  ranitidine (ZANTAC) 150 MG capsule Take 1 capsule (150 mg total) by mouth 2 (two) times daily. 07/28/17   Gunnar Bulla, [name]    No Known Allergies  Family History  Problem Relation Age of Onset  . Diabetes Father   . Hyperlipidemia Father   . Hypertension Father   . Kidney disease Maternal Uncle   . Arthritis Maternal Grandmother   . Cancer Paternal Grandmother   . Breast cancer Paternal Grandmother 51  . Breast cancer Other 77    Social History Social History  Substance Use Topics  . Smoking status: Never Smoker  . Smokeless tobacco: Never Used  . Alcohol use No     Comment: wine cooler x5 year    Review of Systems Constitutional: Negative for fever. Cardiovascular: Negative for chest pain. Respiratory: Negative for shortness of breath. Gastrointestinal: Negative for abdominal pain. Positive for rectal pain. Genitourinary: Negative for vaginal bleeding. Musculoskeletal: Negative for back pain. Skin: Negative for rash. All other ROS negative  ____________________________________________   PHYSICAL EXAM:  VITAL SIGNS: ED Triage Vitals  Enc Vitals Group  BP 08/05/17 1225 110/69     Pulse Rate 08/05/17 1225 (!) 105     Resp 08/05/17 1225 16     Temp 08/05/17 1225 97.7 F (36.5 C)     Temp Source 08/05/17 1225 Oral     SpO2 08/05/17 1225 99 %     Weight 08/05/17 1226 140 lb (63.5 kg)     Height --      Head Circumference --      Peak Flow --      Pain Score 08/05/17 1225 9     Pain Loc --      Pain Edu? --      Excl. in GC? --      Constitutional: Alert and oriented. Well appearing and in no distress. Eyes: Normal exam ENT   Head: Normocephalic and atraumatic.   Mouth/Throat: Mucous membranes are moist. Cardiovascular: Normal rate, regular rhythm. No murmur Respiratory: Normal respiratory effort without tachypnea nor retractions. Breath sounds are clear  Gastrointestinal: gravid abdomen, no tenderness Musculoskeletal: Nontender with normal range of motion in all extremities. Neurologic:  Normal speech and language. No gross focal neurologic deficits Skin:  Skin is warm, dry and intact.  Psychiatric: Mood and affect are normal.   ____________________________________________  INITIAL IMPRESSION / ASSESSMENT AND PLAN / ED COURSE  Pertinent labs & imaging results that were available during my care of the patient were reviewed by me and considered in my medical decision making (see chart for details).  patient presents to the emergency department with hemorrhoidal pain worse over the past 3 days. Patient has a history of having to have a hemorrhoid lanced and drained during her last pregnancy. Patient's exam is largely within normal limits. We'll perform a rectal examination with the nurse once she is available.  Rectal exam the patient has a large approximately 3 cm hemorrhoid on the left side of the anus somewhat purple in appearance extremely tender to palpation consistent with a thrombosed hemorrhoid. I discussed the patient with general surgery, they are down seeing the patient and will likely attempt to perform a bedside hemorrhoid incision and thrombectomy. Patient agreeable to plan.  hemorrhoid has been incised, thrombus removed. We will discharge with Metamucil as well as MiraLAX as needed. Patient already taking docusate. Patient will follow-up with surgery. I discussed Tylenol 1000 mg every 6 hours as needed for pain.    ____________________________________________   FINAL CLINICAL IMPRESSION(S)  / ED DIAGNOSES  hemorrhoidal pain thrombosed hemorrhoid   Minna Antis, MD 08/05/17 1527

## 2017-08-05 NOTE — ED Notes (Signed)
Visual rectal exam done by MD and assisted by this RN

## 2017-08-06 ENCOUNTER — Encounter: Payer: Self-pay | Admitting: Certified Nurse Midwife

## 2017-08-09 ENCOUNTER — Other Ambulatory Visit: Payer: BLUE CROSS/BLUE SHIELD

## 2017-08-09 ENCOUNTER — Encounter: Payer: BLUE CROSS/BLUE SHIELD | Admitting: Certified Nurse Midwife

## 2017-08-14 ENCOUNTER — Ambulatory Visit (INDEPENDENT_AMBULATORY_CARE_PROVIDER_SITE_OTHER): Payer: BLUE CROSS/BLUE SHIELD | Admitting: Certified Nurse Midwife

## 2017-08-14 ENCOUNTER — Encounter: Payer: Self-pay | Admitting: Certified Nurse Midwife

## 2017-08-14 VITALS — BP 120/77 | HR 96 | Wt 140.6 lb

## 2017-08-14 DIAGNOSIS — Z3493 Encounter for supervision of normal pregnancy, unspecified, third trimester: Secondary | ICD-10-CM

## 2017-08-14 LAB — POCT URINALYSIS DIPSTICK
BILIRUBIN UA: NEGATIVE
Glucose, UA: NEGATIVE
Ketones, UA: NEGATIVE
Nitrite, UA: NEGATIVE
PH UA: 6 (ref 5.0–8.0)
Spec Grav, UA: >=1.03 — AB (ref 1.010–1.025)
Urobilinogen, UA: 0.2 E.U./dL

## 2017-08-14 NOTE — Progress Notes (Signed)
[name]- Pt feels very nauseous after having lunch today, she is c/o lots of fatigue

## 2017-08-14 NOTE — Patient Instructions (Signed)

## 2017-08-14 NOTE — Progress Notes (Signed)
[name] , doing well. No complaints. Pt states that she was in hospital recently for thrombosed Hemorrhoids  . Doing better now.  WE discussed lactation, using her pump, donating her milk , length of stay after birth and cord blood donation. Also discussed remainder of prenatal care schedule. She endorses good fetal movement. Return in 3 wks.    Doreene BurkeAnnie Shellye Zandi, [name]

## 2017-08-28 ENCOUNTER — Ambulatory Visit (INDEPENDENT_AMBULATORY_CARE_PROVIDER_SITE_OTHER): Payer: BLUE CROSS/BLUE SHIELD | Admitting: Obstetrics and Gynecology

## 2017-08-28 VITALS — BP 96/67 | HR 85 | Wt 140.2 lb

## 2017-08-28 DIAGNOSIS — Z3493 Encounter for supervision of normal pregnancy, unspecified, third trimester: Secondary | ICD-10-CM

## 2017-08-28 NOTE — Progress Notes (Signed)
[name]- pt is doing well 

## 2017-08-28 NOTE — Progress Notes (Signed)
[name]-denies any concerns, occasional BHC, considering cord blood banking vs. Donation.

## 2017-09-10 ENCOUNTER — Ambulatory Visit (INDEPENDENT_AMBULATORY_CARE_PROVIDER_SITE_OTHER): Payer: BLUE CROSS/BLUE SHIELD | Admitting: Certified Nurse Midwife

## 2017-09-10 ENCOUNTER — Encounter: Payer: Self-pay | Admitting: Certified Nurse Midwife

## 2017-09-10 VITALS — BP 113/79 | HR 110 | Wt 143.7 lb

## 2017-09-10 DIAGNOSIS — Z3493 Encounter for supervision of normal pregnancy, unspecified, third trimester: Secondary | ICD-10-CM

## 2017-09-10 DIAGNOSIS — O2243 Hemorrhoids in pregnancy, third trimester: Secondary | ICD-10-CM

## 2017-09-10 LAB — POCT URINALYSIS DIPSTICK
Glucose, UA: NEGATIVE
KETONES UA: NEGATIVE
Nitrite, UA: NEGATIVE
PH UA: 6 (ref 5.0–8.0)
Protein, UA: NEGATIVE
RBC UA: NEGATIVE
SPEC GRAV UA: 1.025 (ref 1.010–1.025)
Urobilinogen, UA: 0.2 E.U./dL

## 2017-09-10 MED ORDER — ACYCLOVIR 400 MG PO TABS: 400.0000 mg | ORAL_TABLET | Freq: Two times a day (BID) | ORAL | 1 refills | Status: AC

## 2017-09-10 MED ORDER — LIDOCAINE HCL 2 % EX GEL: 1.0000 "application " | CUTANEOUS | 2 refills | Status: DC | PRN

## 2017-09-10 NOTE — Progress Notes (Signed)
[name]

## 2017-09-10 NOTE — Progress Notes (Signed)
[name]-Reports "return" of hemorrhoids on Saturday. Discusses home treatment measures. GI referral placed. Rx: Lidocaine jelly, see orders. Anticipatory guidance regarding 36 week cultures and course of prenatal care. Rx: Acyclovir for HSV prophylaxis, see orders. Pt embarrassed and does not want this mention in birth room or in front of others. Reviewed red flag symptoms and when to call. RTC x 2 weeks for 36 week cultures and [name] or sooner if needed.

## 2017-09-10 NOTE — Patient Instructions (Signed)

## 2017-09-19 ENCOUNTER — Encounter: Payer: Self-pay | Admitting: Certified Nurse Midwife

## 2017-09-19 ENCOUNTER — Other Ambulatory Visit: Payer: Self-pay

## 2017-09-19 ENCOUNTER — Ambulatory Visit (INDEPENDENT_AMBULATORY_CARE_PROVIDER_SITE_OTHER): Payer: BLUE CROSS/BLUE SHIELD | Admitting: Certified Nurse Midwife

## 2017-09-19 VITALS — BP 108/77 | HR 94 | Wt 142.4 lb

## 2017-09-19 DIAGNOSIS — O26893 Other specified pregnancy related conditions, third trimester: Secondary | ICD-10-CM

## 2017-09-19 DIAGNOSIS — Z369 Encounter for antenatal screening, unspecified: Secondary | ICD-10-CM

## 2017-09-19 DIAGNOSIS — Z113 Encounter for screening for infections with a predominantly sexual mode of transmission: Secondary | ICD-10-CM

## 2017-09-19 DIAGNOSIS — N898 Other specified noninflammatory disorders of vagina: Secondary | ICD-10-CM

## 2017-09-19 DIAGNOSIS — Z3493 Encounter for supervision of normal pregnancy, unspecified, third trimester: Secondary | ICD-10-CM

## 2017-09-19 LAB — POCT URINALYSIS DIPSTICK
BILIRUBIN UA: NEGATIVE
Blood, UA: NEGATIVE
GLUCOSE UA: NEGATIVE
KETONES UA: NEGATIVE
LEUKOCYTES UA: NEGATIVE
NITRITE UA: NEGATIVE
PH UA: 6 (ref 5.0–8.0)
Protein, UA: NEGATIVE
Spec Grav, UA: 1.025 (ref 1.010–1.025)
Urobilinogen, UA: 0.2 E.U./dL

## 2017-09-19 NOTE — Patient Instructions (Signed)

## 2017-09-19 NOTE — Progress Notes (Signed)
[name], pt doing well. She complains of increased vaginal discharge, she denies itching , burning , and odor. She is concerned she is leaking amniotic fluid. Nitrazine negative, no fluid seen with speculum exam. Normal appearing mucus. Nuswab plus today. GBS collected. SVE per pt request: 1.5/50/-2. Birth plan discussed. She would like her husband and daughter present for delivery at the head of bed. She would like her son to come in and cut the cord( discussed with her that her bottom will be exposed). She requested to have a blanket draped over her if possible, if not her husband will cut cord. She is planning on epidural but asks about other medication if she is tolerating pain well. Reviewed use of nitrous and IV medications. She also expressed concern with hemorrhoids. She has had thrombosed hemorrhoids with each pregnancy. She has a referral placed for evaluation. She asks if this can be taken care of postpartum. I expressed that if she has not made arrangement prior to delivery that this would be unlikely. She verbalizes understanding. Will follow up with results. Follow up in one wk.  [facility] Hadley [facility], [name]

## 2017-09-21 ENCOUNTER — Encounter: Payer: Self-pay | Admitting: Certified Nurse Midwife

## 2017-09-21 LAB — STREP GP B NAA: Strep Gp B NAA: NEGATIVE

## 2017-09-25 ENCOUNTER — Encounter: Payer: Self-pay | Admitting: Certified Nurse Midwife

## 2017-09-25 LAB — NUSWAB VAGINITIS PLUS (VG+)
CANDIDA GLABRATA, NAA: NEGATIVE
Candida albicans, NAA: NEGATIVE
Trich vag by NAA: NEGATIVE

## 2017-09-25 LAB — GC/CHLAMYDIA PROBE AMP
CHLAMYDIA, DNA PROBE: NEGATIVE
Neisseria gonorrhoeae by PCR: NEGATIVE

## 2017-09-26 ENCOUNTER — Ambulatory Visit (INDEPENDENT_AMBULATORY_CARE_PROVIDER_SITE_OTHER): Payer: BLUE CROSS/BLUE SHIELD | Admitting: Certified Nurse Midwife

## 2017-09-26 ENCOUNTER — Other Ambulatory Visit: Payer: Self-pay

## 2017-09-26 ENCOUNTER — Encounter: Payer: Self-pay | Admitting: Certified Nurse Midwife

## 2017-09-26 VITALS — BP 118/77 | HR 90 | Wt 143.1 lb

## 2017-09-26 DIAGNOSIS — Z3493 Encounter for supervision of normal pregnancy, unspecified, third trimester: Secondary | ICD-10-CM

## 2017-09-26 LAB — POCT URINALYSIS DIPSTICK
BILIRUBIN UA: NEGATIVE
Bilirubin, UA: NEGATIVE
GLUCOSE UA: NEGATIVE
GLUCOSE UA: NEGATIVE
Ketones, UA: NEGATIVE
Ketones, UA: NEGATIVE
Leukocytes, UA: NEGATIVE
Leukocytes, UA: NEGATIVE
NITRITE UA: NEGATIVE
Nitrite, UA: NEGATIVE
Protein, UA: NEGATIVE
Protein, UA: NEGATIVE
RBC UA: NEGATIVE
RBC UA: NEGATIVE
SPEC GRAV UA: 1.015 (ref 1.010–1.025)
SPEC GRAV UA: 1.015 (ref 1.010–1.025)
UROBILINOGEN UA: 0.2 U/dL
Urobilinogen, UA: 0.2 E.U./dL
pH, UA: 6.5 (ref 5.0–8.0)
pH, UA: 6.5 (ref 5.0–8.0)

## 2017-09-26 NOTE — Patient Instructions (Signed)

## 2017-09-26 NOTE — Progress Notes (Signed)
[name], doing well. She has 2 hemorrhoids that are bothersome. She is using hemorrhoid cream and taking warm baths. She has an appointment with GI on Friday. SVE per pt request. 2/60/-2 blood with exam . Reviewed labor precautions and red flag symptoms. Follow up 1 wk.   [facility], [name]

## 2017-09-28 ENCOUNTER — Ambulatory Visit (INDEPENDENT_AMBULATORY_CARE_PROVIDER_SITE_OTHER): Payer: BLUE CROSS/BLUE SHIELD | Admitting: Gastroenterology

## 2017-09-28 ENCOUNTER — Encounter: Payer: Self-pay | Admitting: Gastroenterology

## 2017-09-28 ENCOUNTER — Other Ambulatory Visit: Payer: Self-pay

## 2017-09-28 VITALS — BP 103/69 | HR 108 | Temp 98.6°F | Ht 65.0 in | Wt 144.6 lb

## 2017-09-28 DIAGNOSIS — K645 Perianal venous thrombosis: Secondary | ICD-10-CM

## 2017-09-28 MED ORDER — HYDROCORTISONE ACETATE 25 MG RE SUPP: 25.0000 mg | Freq: Two times a day (BID) | RECTAL | 0 refills | Status: DC

## 2017-09-28 MED ORDER — LIDOCAINE (ANORECTAL) 5 % EX CREA
TOPICAL_CREAM | CUTANEOUS | 0 refills | Status: DC
Start: 2017-09-28 — End: 2017-11-16

## 2017-09-28 NOTE — Progress Notes (Signed)
[facility], MD [address]  [address]  [place], [place] [address]  Main: [phone]  Fax: [phone]    Gastroenterology Consultation  Referring Provider:     Annye AsaLawhorn, Jenkins Michel* Primary Care Physician:  [facility], [facility] P, FNP Primary Gastroenterologist:  Dr. Arlyss Repressohini [facility] Roderic Lammert Reason for Consultation:     Symptomatic hemorrhoids        HPI:   [facility] is a 41 y.o. female referred by Dr. Clint GuyLindley, Anna Parkinsheryl P, FNP  for consultation & management of symptomatic hemorrhoids. Patient is currently in third trimester of her pregnancy, due on day of Christmas.  She reports having severe symptomatic hemorrhoids during her previous pregnancies and this is her fourth pregnancy.  She has experienced thrombosed hemorrhoids in her previous pregnancies that required incision and thrombectomy. She was recently in the [facility] ER on 08/05/2017 with thrombosed hemorrhoid, it was 3 cm in size and underwent incision and thrombectomy by general surgery.  Since last Saturday, patient  has been experiencing severe pain, discomfort, itching, soiling and feels like she developed recurrence of thrombosed hemorrhoid.  She has been using 2.5% hydrocortisone cream 4 times daily, witch hazel, sitz baths, Tucks pads which provided only minimal relief.  She has been taking stool softeners to keep her bowels soft, has been going 1-2 times daily and denies straining or pushing hard.  She denies constipation.  She does have exacerbation of hemorrhoids in between pregnancies but not as severe.  She is frustrated and tearful with the severity of symptoms that she has been going through every time she is pregnant and is trying to find a permanent solution for it.  She reports that she will be planning for another child and want this to be taken care of before she becomes pregnant again.  She denies any other GI symptoms  NSAIDs: None  Antiplts/Anticoagulants/Anti thrombotics: None  GI Procedures: None She  denies family history of GI malignancy  Past Medical History:  Diagnosis Date  . Anxiety   . Arthritis   . Asthma   . Depression   . Diabetes mellitus without complication (HCC)   . Gastroesophageal reflux in pregnancy 07/28/2017  . Hemorrhoid     Past Surgical History:  Procedure Laterality Date  . BUNIONECTOMY Left 1992 and 1998   both left and right    Prior to Admission medications   Medication Sig Start Date End Date Taking? Authorizing Provider  albuterol (PROVENTIL HFA;VENTOLIN HFA) 108 (90 Base) MCG/ACT inhaler Inhale into the lungs every 6 (six) hours as needed for wheezing or shortness of breath.   Yes [provider]  buPROPion (WELLBUTRIN XL) 150 MG 24 hr tablet Take 1 tablet (150 mg total) by mouth daily. 07/16/17  Yes [name], Vanessa DurhamJenkins Michelle, [name]  citalopram (CELEXA) 40 MG tablet Take 1 tablet (40 mg total) by mouth daily. 08/07/16  Yes Allayne Butcherixon, Mary B, [name]  docusate sodium (COLACE) 100 MG capsule Take 1 capsule (100 mg total) by mouth 2 (two) times daily as needed. 07/28/17  Yes [name], Vanessa DurhamJenkins Michelle, [name]  Prenatal-DSS-FeCb-FeGl-FA (CITRANATAL BLOOM) 90-1 MG TABS Take 90 mg by mouth daily. 03/22/17  Yes [name], [name], [name]  ranitidine (ZANTAC) 150 MG capsule Take 1 capsule (150 mg total) by mouth 2 (two) times daily. 07/28/17  Yes [name], Vanessa DurhamJenkins Michelle, [name]  lidocaine (XYLOCAINE) 2 % jelly Apply 1 application as needed topically. Patient not taking: Reported on 09/28/2017 09/10/17   Gunnar BullaLawhorn, Jenkins Michelle, [name]    Family History  Problem Relation Age of Onset  . Diabetes Father   . Hyperlipidemia Father   . Hypertension Father   . Kidney disease Maternal Uncle   . Arthritis Maternal Grandmother   . Cancer Paternal Grandmother   . Breast cancer Paternal Grandmother [id number]  . Breast cancer Other [id number]     Social History   Tobacco Use  . Smoking status: Never Smoker  . Smokeless tobacco: Never Used  Substance Use Topics  . Alcohol use: No     Comment: wine cooler x5 year  . Drug use: No    Allergies as of 09/28/2017  . (No Known Allergies)    Review of Systems:    All systems reviewed and negative except where noted in HPI.   Physical Exam:  BP 103/69 (BP Location: Right Arm, Patient Position: Sitting, Cuff Size: Normal)   Pulse (!) 108   Temp 98.6 F (37 C) (Oral)   Ht 5\' 5"  (1.651 m)   Wt 144 lb 9.6 oz (65.6 kg)   LMP 01/09/2017   BMI 24.06 kg/m  Patient's last menstrual period was 01/09/2017.  General:   Alert,  Well-developed, well-nourished, pleasant and cooperative in NAD Head:  Normocephalic and atraumatic. Eyes:  Sclera clear, no icterus.   Conjunctiva pink. Ears:  Normal auditory acuity. Nose:  No deformity, discharge, or lesions. Mouth:  No deformity or lesions,oropharynx pink & moist. Neck:  Supple; no masses or thyromegaly. Lungs:  Respirations even and unlabored.  Clear throughout to auscultation.   No wheezes, crackles, or rhonchi. No acute distress. Heart:  Regular rate and rhythm; no murmurs, clicks, rubs, or gallops. Abdomen:  Normal bowel sounds.  Gravid uterus, soft, non-tender and non-distended without masses.  No guarding or rebound tenderness.   Rectal: Not performed, perianal exam revealed 2 small external hemorrhoids and one thrombosed external hemorrhoid measuring approximately 2 cm in size, appears to have an organized clot Msk:  Symmetrical without gross deformities. Good, equal movement & strength bilaterally. Pulses:  Normal pulses noted. Extremities:  No clubbing or edema.  No cyanosis. Neurologic:  Alert and oriented x3;  grossly normal neurologically. Skin:  Intact without significant lesions or rashes. No jaundice. Psych:  Alert and cooperative. Normal mood and affect.  Imaging Studies: No abdominal imaging  Assessment and Plan:   [facility] is a 41 y.o. female in third trimester of pregnancy with symptomatic external hemorrhoids.  Currently, her symptoms are secondary to  thrombosed external hemorrhoid for almost 1 week and I suspect that this may not be amenable for incision and drainage given the duration of symptoms.  However, I recommended her to see general surgeon today for further evaluation.  I further discussed with her that after her delivery, about 6 weeks postpartum, she can follow up with me for treatment of hemorrhoids.  She is interested in outpatient hemorrhoid banding after her delivery.  In the interim, she will continue hydrocortisone cream, topical nitroglycerin and lidocaine, sitz baths.  Continue stool softeners and add fiber to have regular bowel movements.  We called surgery clinic and they are able to accommodate her in their clinic today.   Follow up in 2 months   [facility] Harshika Mago, MD

## 2017-10-03 ENCOUNTER — Ambulatory Visit (INDEPENDENT_AMBULATORY_CARE_PROVIDER_SITE_OTHER): Payer: BLUE CROSS/BLUE SHIELD | Admitting: Certified Nurse Midwife

## 2017-10-03 ENCOUNTER — Other Ambulatory Visit: Payer: Self-pay

## 2017-10-03 ENCOUNTER — Inpatient Hospital Stay
Admission: EM | Admit: 2017-10-03 | Discharge: 2017-10-06 | DRG: 807 | Disposition: A | Discharge status: Home / Self Care | Payer: BLUE CROSS/BLUE SHIELD | Attending: Certified Nurse Midwife | Admitting: Certified Nurse Midwife

## 2017-10-03 VITALS — BP 120/78 | HR 103 | Wt 146.2 lb

## 2017-10-03 DIAGNOSIS — F329 Major depressive disorder, single episode, unspecified: Secondary | ICD-10-CM | POA: Diagnosis present

## 2017-10-03 DIAGNOSIS — O26893 Other specified pregnancy related conditions, third trimester: Secondary | ICD-10-CM | POA: Diagnosis present

## 2017-10-03 DIAGNOSIS — K219 Gastro-esophageal reflux disease without esophagitis: Secondary | ICD-10-CM | POA: Diagnosis present

## 2017-10-03 DIAGNOSIS — O9952 Diseases of the respiratory system complicating childbirth: Secondary | ICD-10-CM | POA: Diagnosis present

## 2017-10-03 DIAGNOSIS — O9962 Diseases of the digestive system complicating childbirth: Secondary | ICD-10-CM | POA: Diagnosis present

## 2017-10-03 DIAGNOSIS — Z3493 Encounter for supervision of normal pregnancy, unspecified, third trimester: Secondary | ICD-10-CM

## 2017-10-03 DIAGNOSIS — J45909 Unspecified asthma, uncomplicated: Secondary | ICD-10-CM | POA: Diagnosis present

## 2017-10-03 DIAGNOSIS — O2243 Hemorrhoids in pregnancy, third trimester: Principal | ICD-10-CM | POA: Diagnosis present

## 2017-10-03 DIAGNOSIS — O99344 Other mental disorders complicating childbirth: Secondary | ICD-10-CM | POA: Diagnosis present

## 2017-10-03 DIAGNOSIS — Z3A38 38 weeks gestation of pregnancy: Secondary | ICD-10-CM

## 2017-10-03 DIAGNOSIS — Z6791 Unspecified blood type, Rh negative: Secondary | ICD-10-CM

## 2017-10-03 LAB — POCT URINALYSIS DIPSTICK
Bilirubin, UA: NEGATIVE
Glucose, UA: NEGATIVE
Ketones, UA: NEGATIVE
LEUKOCYTES UA: NEGATIVE
NITRITE UA: NEGATIVE
PH UA: 6.5 (ref 5.0–8.0)
PROTEIN UA: NEGATIVE
RBC UA: NEGATIVE
Spec Grav, UA: 1.01 (ref 1.010–1.025)
UROBILINOGEN UA: 0.2 U/dL

## 2017-10-03 MED ORDER — AMMONIA AROMATIC IN INHA
RESPIRATORY_TRACT | Status: AC
  Filled 2017-10-03: qty 10

## 2017-10-03 MED ORDER — LIDOCAINE HCL (PF) 1 % IJ SOLN
INTRAMUSCULAR | Status: AC
  Filled 2017-10-03: qty 30

## 2017-10-03 MED ORDER — OXYTOCIN 10 UNIT/ML IJ SOLN
INTRAMUSCULAR | Status: AC
  Filled 2017-10-03: qty 2

## 2017-10-03 MED ORDER — MISOPROSTOL 200 MCG PO TABS
ORAL_TABLET | ORAL | Status: AC
  Filled 2017-10-03: qty 2

## 2017-10-03 NOTE — OB Triage Note (Signed)
Patient arrived in triage with c/o leaking of fluid that was clear at first, then yellow/brown.  Reports leaking fluid since approx 2230 as well as contractions. Good fetal movement. Denies vaginal bleeding. Monitors applied and assessing. Discussed plan of care with pt. Pt verbalized understanding.

## 2017-10-03 NOTE — Patient Instructions (Signed)

## 2017-10-03 NOTE — Progress Notes (Signed)
[name]- pt is having a lot of pelvic pressure, back pain

## 2017-10-03 NOTE — Progress Notes (Signed)
[name], doing well.  one of hemorrhoids has ruptured. She has seen [place] [facility] and the plan is if needed we could consult MD on call when she delivers, otherwise she will follow up with them postpartum. Reviewed labor precautions. She will follow up in 1 wk.   Doreene BurkeAnnie Tuwanda Vokes, [name]

## 2017-10-04 ENCOUNTER — Encounter: Payer: Self-pay | Admitting: Anesthesiology

## 2017-10-04 ENCOUNTER — Inpatient Hospital Stay: Payer: BLUE CROSS/BLUE SHIELD | Admitting: Anesthesiology

## 2017-10-04 DIAGNOSIS — F329 Major depressive disorder, single episode, unspecified: Secondary | ICD-10-CM | POA: Diagnosis present

## 2017-10-04 DIAGNOSIS — O99344 Other mental disorders complicating childbirth: Secondary | ICD-10-CM | POA: Diagnosis present

## 2017-10-04 DIAGNOSIS — O26893 Other specified pregnancy related conditions, third trimester: Secondary | ICD-10-CM | POA: Diagnosis present

## 2017-10-04 DIAGNOSIS — Z6791 Unspecified blood type, Rh negative: Secondary | ICD-10-CM | POA: Diagnosis not present

## 2017-10-04 DIAGNOSIS — O9952 Diseases of the respiratory system complicating childbirth: Secondary | ICD-10-CM | POA: Diagnosis present

## 2017-10-04 DIAGNOSIS — O2243 Hemorrhoids in pregnancy, third trimester: Secondary | ICD-10-CM | POA: Diagnosis present

## 2017-10-04 DIAGNOSIS — Z3483 Encounter for supervision of other normal pregnancy, third trimester: Secondary | ICD-10-CM | POA: Diagnosis present

## 2017-10-04 DIAGNOSIS — O9962 Diseases of the digestive system complicating childbirth: Secondary | ICD-10-CM | POA: Diagnosis present

## 2017-10-04 DIAGNOSIS — Z3A38 38 weeks gestation of pregnancy: Secondary | ICD-10-CM | POA: Diagnosis not present

## 2017-10-04 DIAGNOSIS — J45909 Unspecified asthma, uncomplicated: Secondary | ICD-10-CM | POA: Diagnosis present

## 2017-10-04 DIAGNOSIS — K219 Gastro-esophageal reflux disease without esophagitis: Secondary | ICD-10-CM | POA: Diagnosis present

## 2017-10-04 LAB — TYPE AND SCREEN
ABO/RH(D): O NEG
Antibody Screen: POSITIVE

## 2017-10-04 LAB — CBC
HEMATOCRIT: 38.5 % (ref 35.0–47.0)
HEMOGLOBIN: 13 g/dL (ref 12.0–16.0)
MCH: 29.8 pg (ref 26.0–34.0)
MCHC: 33.8 g/dL (ref 32.0–36.0)
MCV: 88 fL (ref 80.0–100.0)
Platelets: 209 10*3/uL (ref 150–440)
RBC: 4.37 MIL/uL (ref 3.80–5.20)
RDW: 14.8 % — ABNORMAL HIGH (ref 11.5–14.5)
WBC: 11.6 10*3/uL — AB (ref 3.6–11.0)

## 2017-10-04 MED ORDER — BUPROPION HCL ER (XL) 150 MG PO TB24
150.0000 mg | ORAL_TABLET | Freq: Every day | ORAL | Status: DC
  Administered 2017-10-04 – 2017-10-05 (×2): 150 mg via ORAL
  Filled 2017-10-04 (×4): qty 1

## 2017-10-04 MED ORDER — TERBUTALINE SULFATE 1 MG/ML IJ SOLN: 0.2500 mg | Freq: Once | INTRAMUSCULAR | Status: DC | PRN

## 2017-10-04 MED ORDER — DIPHENHYDRAMINE HCL 50 MG/ML IJ SOLN
12.5000 mg | INTRAMUSCULAR | Status: DC | PRN
  Administered 2017-10-04 (×2): 12.5 mg via INTRAVENOUS
  Filled 2017-10-04 (×2): qty 1

## 2017-10-04 MED ORDER — ONDANSETRON HCL 4 MG PO TABS: 4.0000 mg | ORAL_TABLET | ORAL | Status: DC | PRN

## 2017-10-04 MED ORDER — LACTATED RINGERS IV SOLN
500.0000 mL | Freq: Once | INTRAVENOUS | Status: AC
  Administered 2017-10-04: 500 mL via INTRAVENOUS

## 2017-10-04 MED ORDER — ACETAMINOPHEN 325 MG PO TABS: 650.0000 mg | ORAL_TABLET | ORAL | Status: DC | PRN

## 2017-10-04 MED ORDER — FENTANYL 2.5 MCG/ML W/ROPIVACAINE 0.15% IN NS 100 ML EPIDURAL (ARMC)
12.0000 mL/h | EPIDURAL | Status: DC
  Administered 2017-10-04: 12 mL/h via EPIDURAL

## 2017-10-04 MED ORDER — SOD CITRATE-CITRIC ACID 500-334 MG/5ML PO SOLN: 30.0000 mL | ORAL | Status: DC | PRN

## 2017-10-04 MED ORDER — PHENYLEPHRINE 40 MCG/ML (10ML) SYRINGE FOR IV PUSH (FOR BLOOD PRESSURE SUPPORT)
80.0000 ug | PREFILLED_SYRINGE | INTRAVENOUS | Status: DC | PRN
  Filled 2017-10-04: qty 5

## 2017-10-04 MED ORDER — CITALOPRAM HYDROBROMIDE 20 MG PO TABS
40.0000 mg | ORAL_TABLET | Freq: Every day | ORAL | Status: DC
  Administered 2017-10-04 – 2017-10-05 (×2): 40 mg via ORAL
  Filled 2017-10-04: qty 2
  Filled 2017-10-04: qty 1
  Filled 2017-10-04: qty 2
  Filled 2017-10-04: qty 1

## 2017-10-04 MED ORDER — BUTORPHANOL TARTRATE 1 MG/ML IJ SOLN: 1.0000 mg | INTRAMUSCULAR | Status: DC | PRN

## 2017-10-04 MED ORDER — BENZOCAINE-MENTHOL 20-0.5 % EX AERO
1.0000 "application " | INHALATION_SPRAY | CUTANEOUS | Status: DC | PRN
  Administered 2017-10-04: 1 via TOPICAL
  Filled 2017-10-04: qty 56

## 2017-10-04 MED ORDER — TETANUS-DIPHTH-ACELL PERTUSSIS 5-2.5-18.5 LF-MCG/0.5 IM SUSP: 0.5000 mL | Freq: Once | INTRAMUSCULAR | Status: DC

## 2017-10-04 MED ORDER — ONDANSETRON HCL 4 MG/2ML IJ SOLN: 4.0000 mg | Freq: Four times a day (QID) | INTRAMUSCULAR | Status: DC | PRN

## 2017-10-04 MED ORDER — FERROUS SULFATE 325 (65 FE) MG PO TABS
325.0000 mg | ORAL_TABLET | Freq: Every day | ORAL | Status: DC
  Administered 2017-10-05 – 2017-10-06 (×2): 325 mg via ORAL
  Filled 2017-10-04 (×3): qty 1

## 2017-10-04 MED ORDER — DOCUSATE SODIUM 100 MG PO CAPS
100.0000 mg | ORAL_CAPSULE | Freq: Two times a day (BID) | ORAL | Status: DC
  Administered 2017-10-04 – 2017-10-05 (×3): 100 mg via ORAL
  Filled 2017-10-04 (×3): qty 1

## 2017-10-04 MED ORDER — OXYTOCIN 40 UNITS IN LACTATED RINGERS INFUSION - SIMPLE MED
1.0000 m[IU]/min | INTRAVENOUS | Status: DC
  Administered 2017-10-04: 1 m[IU]/min via INTRAVENOUS

## 2017-10-04 MED ORDER — EPHEDRINE 5 MG/ML INJ
10.0000 mg | INTRAVENOUS | Status: DC | PRN
  Filled 2017-10-04: qty 2

## 2017-10-04 MED ORDER — LACTATED RINGERS IV SOLN
INTRAVENOUS | Status: DC
  Administered 2017-10-04 (×3): via INTRAVENOUS

## 2017-10-04 MED ORDER — OXYTOCIN BOLUS FROM INFUSION
500.0000 mL | Freq: Once | INTRAVENOUS | Status: AC
  Administered 2017-10-04: 500 mL via INTRAVENOUS

## 2017-10-04 MED ORDER — WITCH HAZEL-GLYCERIN EX PADS
1.0000 "application " | MEDICATED_PAD | CUTANEOUS | Status: DC | PRN
  Administered 2017-10-04: 1 via TOPICAL
  Filled 2017-10-04: qty 100

## 2017-10-04 MED ORDER — COCONUT OIL OIL
1.0000 "application " | TOPICAL_OIL | Status: DC | PRN
  Administered 2017-10-04: 1 via TOPICAL
  Filled 2017-10-04: qty 120

## 2017-10-04 MED ORDER — METHYLERGONOVINE MALEATE 0.2 MG/ML IJ SOLN: 0.2000 mg | INTRAMUSCULAR | Status: DC | PRN

## 2017-10-04 MED ORDER — ONDANSETRON HCL 4 MG/2ML IJ SOLN
4.0000 mg | INTRAMUSCULAR | Status: DC | PRN
Start: 2017-10-04 — End: 2017-10-06

## 2017-10-04 MED ORDER — PRENATAL MULTIVITAMIN CH
1.0000 | ORAL_TABLET | Freq: Every day | ORAL | Status: DC
  Administered 2017-10-04 – 2017-10-05 (×2): 1 via ORAL
  Filled 2017-10-04 (×2): qty 1

## 2017-10-04 MED ORDER — BUPIVACAINE HCL (PF) 0.25 % IJ SOLN
INTRAMUSCULAR | Status: DC | PRN
  Administered 2017-10-04: 5 mL via EPIDURAL

## 2017-10-04 MED ORDER — OXYCODONE-ACETAMINOPHEN 5-325 MG PO TABS
1.0000 | ORAL_TABLET | ORAL | Status: DC | PRN
  Administered 2017-10-04 – 2017-10-06 (×4): 1 via ORAL
  Filled 2017-10-04 (×3): qty 1

## 2017-10-04 MED ORDER — METHYLERGONOVINE MALEATE 0.2 MG PO TABS: 0.2000 mg | ORAL_TABLET | ORAL | Status: DC | PRN

## 2017-10-04 MED ORDER — OXYCODONE-ACETAMINOPHEN 5-325 MG PO TABS
2.0000 | ORAL_TABLET | ORAL | Status: DC | PRN
  Administered 2017-10-04 – 2017-10-05 (×6): 2 via ORAL
  Filled 2017-10-04 (×7): qty 2

## 2017-10-04 MED ORDER — DIBUCAINE 1 % RE OINT: 1.0000 "application " | TOPICAL_OINTMENT | RECTAL | Status: DC | PRN

## 2017-10-04 MED ORDER — LACTATED RINGERS IV SOLN: 500.0000 mL | INTRAVENOUS | Status: DC | PRN

## 2017-10-04 MED ORDER — SENNOSIDES-DOCUSATE SODIUM 8.6-50 MG PO TABS
2.0000 | ORAL_TABLET | ORAL | Status: DC
  Administered 2017-10-05: 2 via ORAL
  Filled 2017-10-04: qty 2

## 2017-10-04 MED ORDER — OXYCODONE-ACETAMINOPHEN 5-325 MG PO TABS
ORAL_TABLET | ORAL | Status: AC
  Administered 2017-10-04: 1 via ORAL
  Filled 2017-10-04: qty 1

## 2017-10-04 MED ORDER — FENTANYL 2.5 MCG/ML W/ROPIVACAINE 0.15% IN NS 100 ML EPIDURAL (ARMC)
EPIDURAL | Status: AC
  Filled 2017-10-04: qty 100

## 2017-10-04 MED ORDER — IBUPROFEN 600 MG PO TABS
600.0000 mg | ORAL_TABLET | Freq: Four times a day (QID) | ORAL | Status: DC
  Administered 2017-10-05 – 2017-10-06 (×2): 600 mg via ORAL
  Filled 2017-10-04 (×4): qty 1

## 2017-10-04 MED ORDER — OXYTOCIN 40 UNITS IN LACTATED RINGERS INFUSION - SIMPLE MED
2.5000 [IU]/h | INTRAVENOUS | Status: DC
  Filled 2017-10-04: qty 1000

## 2017-10-04 MED ORDER — SIMETHICONE 80 MG PO CHEW: 80.0000 mg | CHEWABLE_TABLET | ORAL | Status: DC | PRN

## 2017-10-04 NOTE — Progress Notes (Signed)
LABOR NOTE   Anna Burch 41 y.o.GP@ at 1770w2d Early latent labor.  SUBJECTIVE:  Pt comfortable with epidural  OBJECTIVE:  BP 102/61 (BP Location: Left Arm)   Pulse 97   Temp 98 F (36.7 C) (Oral)   Resp 18   Ht 5\' 5"  (1.651 m)   Wt 146 lb (66.2 kg)   LMP 01/09/2017   SpO2 99%   BMI 24.30 kg/m  Total I/O In: 1177.1 [I.V.:1177.1] Out: -   She has not shown cervical change. CERVIX: 4cm:  90:   -2:   mid position:   soft SVE:   Dilation: 4 Effacement (%): 90 Station: -2 Exam by:: A. Marticia Reifschneider [name] CONTRACTIONS: irregular, every 4-5 minutes FHR: Fetal heart tracing reviewed. Baseline: 120 bpm, Variability: Good {> 6 bpm), Accelerations: Reactive and Decelerations: Late Category II   Analgesia: Epidural  Labs: Lab Results  Component Value Date   WBC 11.6 (H) 10/04/2017   HGB 13.0 10/04/2017   HCT 38.5 10/04/2017   MCV 88.0 10/04/2017   PLT 209 10/04/2017    ASSESSMENT: 1) Labor curve reviewed.       Progress: Early latent labor.     Membranes: ruptured, meconium light        Active Problems:   Labor and delivery, indication for care   PLAN: IUPC placed IV fluid bolus and change maternal position, late declarations resolved. Start pitocin per protocol.   Doreene Burkennie Jeffrey Voth, [name]  10/04/2017 4:25 AM

## 2017-10-04 NOTE — Lactation Note (Addendum)
This note was copied from a baby's chart. Lactation Consultation Note  Patient Name: Anna Burch ZOXWR'UToday's Date: 10/04/2017 Reason for consult: Initial assessment   Maternal Data    Feeding Feeding Type: Donor Breast Milk Length of feed: (infant unitnterested in feeding )  LATCH Score Latch: Too sleepy or reluctant, no latch achieved, no sucking elicited.  Audible Swallowing: None  Type of Nipple: Everted at rest and after stimulation  Comfort (Breast/Nipple): Soft / non-tender  Hold (Positioning): Assistance needed to correctly position infant at breast and maintain latch.  LATCH Score: 5  Interventions Interventions: Breast feeding basics reviewed;Assisted with latch;Skin to skin  Lactation Tools Discussed/Used Mother was taught how to hand express and use a pump. After the baby was fed by syringe 5 ml of breast milk, in additional 10 ml of donor breast milk .   The infants blood sugar was then retaken and was  wnl.   Consult Status Consult Status: PRN Follow-up type: In-patient    Anna Burch 10/04/2017, 4:03 PM

## 2017-10-04 NOTE — Plan of Care (Signed)
Discussed plan of care with patient. Patient verbalized understanding.

## 2017-10-04 NOTE — Anesthesia Procedure Notes (Signed)
Epidural Patient location during procedure: OB  Staffing Anesthesiologist: Cheyrl Buley, MD Performed: anesthesiologist   Preanesthetic Checklist Completed: patient identified, site marked, surgical consent, pre-op evaluation, timeout performed, IV checked, risks and benefits discussed and monitors and equipment checked  Epidural Patient position: sitting Prep: ChloraPrep Patient monitoring: heart rate, continuous pulse ox and blood pressure Approach: midline Location: L4-L5 Injection technique: LOR saline  Needle:  Needle type: Tuohy  Needle gauge: 18 G Needle length: 9 cm and 9 Catheter type: closed end flexible Catheter size: 20 Guage Test dose: negative and 1.5% lidocaine with Epi 1:200 K  Assessment Sensory level: T10 Events: blood not aspirated, injection not painful, no injection resistance, negative IV test and no paresthesia  Additional Notes   Patient tolerated the insertion well without complications.Reason for block:procedure for pain     

## 2017-10-04 NOTE — H&P (Signed)
History and Physical   HPI  Anna Burch is a 41 y.o. Anna Burch at 7336w2d Estimated Date of Delivery: 10/16/17 who is being admitted for SROM @ 2230.    OB History  Obstetric History   G5   P3   T0   P0   A1   L3    SAB0   TAB1   Ectopic0   Multiple0   Live Births3     # Outcome Date GA Lbr Len/2nd Weight Sex Delivery Anes PTL Lv  5 Current           4 Para 2006    F Vag-Spont EPI  LIV  3 Para 1999    M Vag-Spont EPI  LIV  2 Para 1998    F Vag-Spont EPI  LIV  1 TAB      TAB         PROBLEM LIST  Pregnancy complications or risks: Patient Active Problem List   Diagnosis Date Noted  . Labor and delivery, indication for care 10/04/2017  . Gastroesophageal reflux in pregnancy 07/28/2017  . Hemorrhoids during pregnancy in third trimester 07/28/2017  . 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 Rh negative state in antepartum period 03/23/2017  . Generalized anxiety disorder 08/07/2016  . Depression 08/07/2016    Prenatal labs and studies: ABO, Rh: O/Negative/-- (05/31 1133) Antibody: Negative (05/31 1133) Rubella: 8.29 (05/31 1133) RPR:    HBsAg: Negative (05/31 1133)  HIV:   negative  JYN:WGNFAOZHGBS:Negative (11/28 1656)   Past Medical History:  Diagnosis Date  . Anxiety   . Arthritis   . Asthma    no recent attacks  . Depression   . Diabetes mellitus without complication (HCC)   . Gastroesophageal reflux in pregnancy 07/28/2017  . Hemorrhoid      Past Surgical History:  Procedure Laterality Date  . BUNIONECTOMY Left 1992 and 1998   both left and right     Medications      Medication List    ASK your doctor about these medications   acyclovir 400 MG tablet Commonly known as:  ZOVIRAX   albuterol 108 (90 Base) MCG/ACT inhaler Commonly known as:  PROVENTIL HFA;VENTOLIN HFA   buPROPion 150 MG 24 hr tablet Commonly known as:  WELLBUTRIN XL Take 1 tablet (150 mg total) by mouth daily.   citalopram 40 MG tablet Commonly known as:  CELEXA Take 1 tablet (40 mg total) by mouth  daily.   CITRANATAL BLOOM 90-1 MG Tabs Take 90 mg by mouth daily.   docusate sodium 100 MG capsule Commonly known as:  COLACE Take 1 capsule (100 mg total) by mouth 2 (two) times daily as needed.   hydrocortisone 25 MG suppository Commonly known as:  ANUSOL-HC Place 1 suppository (25 mg total) rectally 2 (two) times daily.   Lidocaine (Anorectal) 5 % Crea Apply rectally 2-3 times daily   lidocaine 2 % jelly Commonly known as:  XYLOCAINE Apply 1 application as needed topically.   ranitidine 150 MG capsule Commonly known as:  ZANTAC Take 1 capsule (150 mg total) by mouth 2 (two) times daily.        Allergies  Patient has no known allergies.  Review of Systems  Constitutional: negative Eyes: negative Ears, nose, mouth, throat, and face: negative Respiratory: negative Cardiovascular: negative Gastrointestinal: negative Genitourinary:negative Integument/breast: negative Hematologic/lymphatic: negative Musculoskeletal:negative Neurological: negative Behavioral/Psych: negative Endocrine: negative Allergic/Immunologic: negative  Physical Exam  BP 122/80 (BP Location: Left Arm)   Pulse 87   Temp 98.1 F (  36.7 C) (Oral)   Resp 18   Ht 5\' 5"  (1.651 m)   Wt 146 lb (66.2 kg)   LMP 01/09/2017   BMI 24.30 kg/m   Lungs:  CTA B Cardio: RRR  Abd: Soft, gravid, NT Presentation: cephalic EXT 1+ Edema DTRs: 2+ B CERVIX: 3.5cm  :  70%:   -2 per RN exam  See Prenatal records for more detailed PE.     FHR:  Baseline: 125 bpm, Variability: Good {> 6 bpm), Accelerations: Reactive and Decelerations: Absent  Toco: Uterine Contractions: Frequency: Every 3-5 minutes   Test Results  Results for orders placed or performed in visit on 10/03/17 (from the past 24 hour(s))  POCT urinalysis dipstick     Status: None   Collection Time: 10/03/17  4:43 PM  Result Value Ref Range   Color, UA yellow    Clarity, UA clear    Glucose, UA neg    Bilirubin, UA neg     Ketones, UA neg    Spec Grav, UA 1.010 1.010 - 1.025   Blood, UA neg    pH, UA 6.5 5.0 - 8.0   Protein, UA neg    Urobilinogen, UA 0.2 0.2 or 1.0 E.U./dL   Nitrite, UA neg    Leukocytes, UA Negative Negative   Appearance     Odor     Group B Strep negative, Rh negative  Assessment   [id number] at 1446w2d Estimated Date of Delivery: 10/16/17  The fetus is reassuring.   Patient Active Problem List   Diagnosis Date Noted  . Labor and delivery, indication for care 10/04/2017  . Gastroesophageal reflux in pregnancy 07/28/2017  . Hemorrhoids during pregnancy in third trimester 07/28/2017  . 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 Rh negative state in antepartum period 03/23/2017  . Generalized anxiety disorder 08/07/2016  . Depression 08/07/2016    Plan  1. Admit to L&D :   continue present management and IV Pitocin augmentation if no cervical change 2. EFM:-- Category 1 3. Epidural if desired. Stadol for IV pain until epidural requested. 4. Admission labs  5. HSV , on acyclovir. 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

## 2017-10-04 NOTE — Lactation Note (Signed)
information about milk donation was given

## 2017-10-04 NOTE — Anesthesia Preprocedure Evaluation (Signed)
Anesthesia Evaluation  Patient identified by MRN, date of birth, ID band Patient awake    Reviewed: Allergy & Precautions, NPO status , Patient's Chart, lab work & pertinent test results, reviewed documented beta blocker date and time   Airway Mallampati: II  TM Distance: >3 FB     Dental  (+) Chipped   Pulmonary asthma ,           Cardiovascular      Neuro/Psych PSYCHIATRIC DISORDERS Anxiety Depression    GI/Hepatic GERD  ,  Endo/Other  diabetes, Type 2  Renal/GU      Musculoskeletal  (+) Arthritis ,   Abdominal   Peds  Hematology   Anesthesia Other Findings   Reproductive/Obstetrics                             Anesthesia Physical Anesthesia Plan  ASA: II  Anesthesia Plan: Epidural   Post-op Pain Management:    Induction:   PONV Risk Score and Plan:   Airway Management Planned:   Additional Equipment:   Intra-op Plan:   Post-operative Plan:   Informed Consent: I have reviewed the patients History and Physical, chart, labs and discussed the procedure including the risks, benefits and alternatives for the proposed anesthesia with the patient or authorized representative who has indicated his/her understanding and acceptance.     Plan Discussed with: CRNA  Anesthesia Plan Comments:         Anesthesia Quick Evaluation

## 2017-10-05 LAB — RPR: RPR Ser Ql: NONREACTIVE

## 2017-10-05 LAB — FETAL SCREEN: Fetal Screen: NEGATIVE

## 2017-10-05 LAB — CBC
HEMATOCRIT: 33.1 % — AB (ref 35.0–47.0)
HEMOGLOBIN: 11.2 g/dL — AB (ref 12.0–16.0)
MCH: 30.3 pg (ref 26.0–34.0)
MCHC: 33.9 g/dL (ref 32.0–36.0)
MCV: 89.1 fL (ref 80.0–100.0)
Platelets: 178 10*3/uL (ref 150–440)
RBC: 3.71 MIL/uL — AB (ref 3.80–5.20)
RDW: 15 % — ABNORMAL HIGH (ref 11.5–14.5)
WBC: 8.7 10*3/uL (ref 3.6–11.0)

## 2017-10-05 MED ORDER — HYDROCORTISONE 2.5 % RE CREA
TOPICAL_CREAM | Freq: Four times a day (QID) | RECTAL | Status: DC | PRN
  Filled 2017-10-05: qty 28.35

## 2017-10-05 MED ORDER — RHO D IMMUNE GLOBULIN 1500 UNIT/2ML IJ SOSY
300.0000 ug | PREFILLED_SYRINGE | Freq: Once | INTRAMUSCULAR | Status: AC
  Administered 2017-10-05: 300 ug via INTRAVENOUS
  Filled 2017-10-05: qty 2

## 2017-10-05 NOTE — Lactation Note (Signed)
This note was copied from a baby's chart. Lactation Consultation Note  Patient Name: Anna Burch Reason for consult: Follow-up assessment;Difficult latch   Maternal Data    Feeding Feeding Type: Bottle Fed - Formula Nipple Type: Slow - flow Length of feed: 5 min  LATCH Score Latch: Too sleepy or reluctant, no latch achieved, no sucking elicited.                 Interventions    Lactation Tools Discussed/Used     Consult Status Consult Status: Follow-up Date: 10/05/17 Mom is to pump if baby will not latch in order to induce supply and feed baby afterwards.   Burnadette PeterJaniya M Tynetta Bachmann Burch, 5:20 PM

## 2017-10-05 NOTE — Progress Notes (Signed)
Progress Note - Vaginal Delivery  Anna ChesterfieldHolly Burch is a 41 y.o. [id number] now PP day 1 s/p Vaginal, Spontaneous .   Subjective:  The patient reports no complaints, up ad lib, voiding, tolerating PO and + flatus  Objective:  Vital signs in last 24 hours: Temp:  [97.7 F (36.5 C)-98.7 F (37.1 C)] 98 F (36.7 C) (12/14 0423) Pulse Rate:  [78-104] 89 (12/14 0423) Resp:  [18-20] 20 (12/14 0423) BP: (90-131)/(49-84) 103/68 (12/14 0423) SpO2:  [96 %-98 %] 96 % (12/14 0423)  Physical Exam:  General: alert, cooperative, appears stated age and fatigued Lochia: appropriate Uterine Fundus: firm DVT Evaluation: No evidence of DVT seen on physical exam. No cords or calf tenderness. No significant calf/ankle edema.    Data Review Recent Labs    10/04/17 0004 10/05/17 0410  HGB 13.0 11.2*  HCT 38.5 33.1*    Assessment/Plan: Active Problems:   Labor and delivery, indication for care   Plan for discharge tomorrow  -- Continue routine PP care.     Anna Burch, [name]  10/05/2017 7:18 AM

## 2017-10-05 NOTE — Anesthesia Postprocedure Evaluation (Signed)
Anesthesia Post Note  Patient: Anna ChesterfieldHolly Gabbert  Procedure(s) Performed: AN AD HOC LABOR EPIDURAL  Patient location during evaluation: Mother Baby Anesthesia Type: Epidural Level of consciousness: awake, awake and alert and oriented Pain management: pain level controlled Vital Signs Assessment: post-procedure vital signs reviewed and stable Respiratory status: spontaneous breathing Cardiovascular status: blood pressure returned to baseline Postop Assessment: no headache, no backache, no apparent nausea or vomiting and adequate PO intake Anesthetic complications: no     Last Vitals:  Vitals:   10/05/17 0054 10/05/17 0423  BP: (!) 90/49 103/68  Pulse: 91 89  Resp: 20 20  Temp: 36.5 C 36.7 C  SpO2:  96%    Last Pain:  Vitals:   10/05/17 0423  TempSrc: Oral  PainSc:                  Karoline Caldwelleana Berlie Persky

## 2017-10-06 LAB — RHOGAM INJECTION: Unit division: 0

## 2017-10-06 MED ORDER — HYDROCORTISONE 2.5 % RE CREA: TOPICAL_CREAM | Freq: Four times a day (QID) | RECTAL | 0 refills | Status: DC | PRN

## 2017-10-06 MED ORDER — OXYCODONE-ACETAMINOPHEN 5-325 MG PO TABS: 1.0000 | ORAL_TABLET | ORAL | 0 refills | Status: DC | PRN

## 2017-10-06 NOTE — Discharge Summary (Signed)
Discharge Summary  Date of Admission: 10/03/2017  Date of Discharge: 10/06/2017  Admitting Diagnosis: SROM at 3362w2d  Mode of Delivery: normal spontaneous vaginal delivery                Discharge Diagnosis: No other diagnosis   Intrapartum Procedures: epidural and laceration 1st perineal    Post partum procedures: none  Complications: external hemorrhoids. [facility] to [facility] taken care of postpartum                      Discharge Day SOAP Note:  Progress Note - Vaginal Delivery  Anna ChesterfieldHolly Bulthuis is a 41 y.o. [id number] now PP day 2 s/p Vaginal, Spontaneous . Delivery was uncomplicated  Subjective  The patient has the following complaints: has no unusual complaints  Pain is controlled with current medications.   Patient is urinating without difficulty.  She is ambulating well.    Objective  Vital signs: BP 116/72 (BP Location: Left Arm)   Pulse 83   Temp 97.8 F (36.6 C) (Oral)   Resp 18   Ht 5\' 5"  (1.651 m)   Wt 146 lb (66.2 kg)   LMP 01/09/2017   SpO2 96%   Breastfeeding? Unknown   BMI 24.30 kg/m   Physical Exam: Gen: NAD Fundus Fundal Tone: Firm  Lochia Amount: Scant  Perineum Appearance: Approximated, Hemorrhoid, Edematous     Data Review Labs: CBC Latest Ref Rng & Units 10/05/2017 10/04/2017 07/26/2017  WBC 3.6 - 11.0 K/uL 8.7 11.6(H) 10.5  Hemoglobin 12.0 - 16.0 g/dL 11.2(L) 13.0 12.1  Hematocrit 35.0 - 47.0 % 33.1(L) 38.5 36.9  Platelets 150 - 440 K/uL 178 209 212   O NEG  Assessment/Plan  Active Problems:   Labor and delivery, indication for care    Plan for discharge today.   Discharge Instructions: Per After Visit Summary. Activity: Advance as tolerated. Pelvic rest for 6 weeks.  Also refer to After Visit Summary Diet: Regular Medications: Allergies as of 10/06/2017   No Known Allergies     Medication List    STOP taking these medications   hydrocortisone 25 MG  suppository Commonly known as:  ANUSOL-HC     TAKE these medications   acyclovir 400 MG tablet Commonly known as:  ZOVIRAX Take 400 mg by mouth 5 (five) times daily.   albuterol 108 (90 Base) MCG/ACT inhaler Commonly known as:  PROVENTIL HFA;VENTOLIN HFA Inhale into the lungs every 6 (six) hours as needed for wheezing or shortness of breath.   buPROPion 150 MG 24 hr tablet Commonly known as:  WELLBUTRIN XL Take 1 tablet (150 mg total) by mouth daily.   citalopram 40 MG tablet Commonly known as:  CELEXA Take 1 tablet (40 mg total) by mouth daily.   CITRANATAL BLOOM 90-1 MG Tabs Take 90 mg by mouth daily.   docusate sodium 100 MG capsule Commonly known as:  COLACE Take 1 capsule (100 mg total) by mouth 2 (two) times daily as needed.   hydrocortisone 2.5 % rectal cream Commonly known as:  ANUSOL-HC Place rectally 4 (four) times daily as needed for hemorrhoids or anal itching.   Lidocaine (Anorectal) 5 % Crea Apply rectally 2-3 times daily   lidocaine 2 % jelly Commonly known as:  XYLOCAINE Apply 1 application as needed topically.   oxyCODONE-acetaminophen 5-325 MG tablet Commonly known as:  PERCOCET/ROXICET Take 1 tablet by mouth every 4 (four) hours as needed (pain scale 4-7).   ranitidine  150 MG capsule Commonly known as:  ZANTAC Take 1 capsule (150 mg total) by mouth 2 (two) times daily.      Outpatient follow up:  Postpartum contraception: She is undecided,  will discuss at first office visit post-partum  Discharged Condition: good  Discharged to: home  Newborn Data: Disposition:home with mother  Apgars: APGAR (1 MIN): 7   APGAR (5 MINS): 9   APGAR (10 MINS):    Baby Feeding: Breast    Doreene Burkennie Josphine Laffey, [name]  10/06/2017 9:43 AM

## 2017-10-06 NOTE — Final Progress Note (Signed)
Discharge Day SOAP Note:  Progress Note - Vaginal Delivery  Anna Burch is a [age] y.o. [id number] now PP day 2 s/p Vaginal, Spontaneous . Delivery was uncomplicated  Subjective  The patient has the following complaints: has no unusual complaints  Pain is controlled with current medications.   Patient is urinating without difficulty.  She is ambulating well.    Objective  Vital signs: BP 116/72 (BP Location: Left Arm)   Pulse 83   Temp 97.8 F (36.6 C) (Oral)   Resp 18   Ht 5\' 5"  (1.651 m)   Wt 146 lb (66.2 kg)   LMP 01/09/2017   SpO2 96%   Breastfeeding? Unknown   BMI 24.30 kg/m   Physical Exam: Gen: NAD Fundus Fundal Tone: Firm  Lochia Amount: Scant  Perineum Appearance: Approximated, Hemorrhoid, Edematous     Data Review Labs: CBC Latest Ref Rng & Units 10/05/2017 10/04/2017 07/26/2017  WBC 3.6 - 11.0 K/uL 8.7 11.6(H) 10.5  Hemoglobin 12.0 - 16.0 g/dL 11.2(L) 13.0 12.1  Hematocrit 35.0 - 47.0 % 33.1(L) 38.5 36.9  Platelets 150 - 440 K/uL 178 209 212   O NEG  Assessment/Plan  Active Problems:   Labor and delivery, indication for care    Plan for discharge today.   Discharge Instructions: Per After Visit Summary. Activity: Advance as tolerated. Pelvic rest for 6 weeks.  Also refer to After Visit Summary Diet: Regular Medications: Allergies as of 10/06/2017   No Known Allergies     Medication List    STOP taking these medications   hydrocortisone 25 MG suppository Commonly known as:  ANUSOL-HC     TAKE these medications   acyclovir 400 MG tablet Commonly known as:  ZOVIRAX Take 400 mg by mouth 5 (five) times daily.   albuterol 108 (90 Base) MCG/ACT inhaler Commonly known as:  PROVENTIL HFA;VENTOLIN HFA Inhale into the lungs every 6 (six) hours as needed for wheezing or shortness of breath.   buPROPion 150 MG 24 hr tablet Commonly known as:  WELLBUTRIN XL Take 1 tablet (150 mg total) by mouth daily.   citalopram 40 MG  tablet Commonly known as:  CELEXA Take 1 tablet (40 mg total) by mouth daily.   CITRANATAL BLOOM 90-1 MG Tabs Take 90 mg by mouth daily.   docusate sodium 100 MG capsule Commonly known as:  COLACE Take 1 capsule (100 mg total) by mouth 2 (two) times daily as needed.   hydrocortisone 2.5 % rectal cream Commonly known as:  ANUSOL-HC Place rectally 4 (four) times daily as needed for hemorrhoids or anal itching.   Lidocaine (Anorectal) 5 % Crea Apply rectally 2-3 times daily   lidocaine 2 % jelly Commonly known as:  XYLOCAINE Apply 1 application as needed topically.   oxyCODONE-acetaminophen 5-325 MG tablet Commonly known as:  PERCOCET/ROXICET Take 1 tablet by mouth every 4 (four) hours as needed (pain scale 4-7).   ranitidine 150 MG capsule Commonly known as:  ZANTAC Take 1 capsule (150 mg total) by mouth 2 (two) times daily.      Outpatient follow up:  Postpartum contraception: She is undecided,  will discuss at first office visit post-partum  Discharged Condition: good  Discharged to: home  Newborn Data: Disposition:home with mother  Apgars: APGAR (1 MIN): 7   APGAR (5 MINS): 9   APGAR (10 MINS):    Baby Feeding: Breast  Doreene Burkennie Cale Bethard, [name]  10/06/2017 9:43 AM

## 2017-10-06 NOTE — Progress Notes (Signed)
Discharge instructions complete and prescriptions given. Patient verbalizes understanding of teaching. Patient discharged home at 10:55am.

## 2017-10-10 ENCOUNTER — Encounter: Payer: BLUE CROSS/BLUE SHIELD | Admitting: Certified Nurse Midwife

## 2017-11-16 ENCOUNTER — Ambulatory Visit (INDEPENDENT_AMBULATORY_CARE_PROVIDER_SITE_OTHER): Payer: BLUE CROSS/BLUE SHIELD | Admitting: Certified Nurse Midwife

## 2017-11-16 ENCOUNTER — Encounter: Payer: Self-pay | Admitting: Certified Nurse Midwife

## 2017-11-16 NOTE — Progress Notes (Signed)
Pt is here for a post partum visit. Breast fed for 3 weeks now bottle feeding. Has not had a period. Has not resumed intercourse. She would like to discuss an U/S to check ovaries and discuss testing for husband. PP screening 0

## 2017-11-16 NOTE — Patient Instructions (Signed)
Preventive Care 18-39 Years, Female Preventive care refers to lifestyle choices and visits with your health care provider that can promote health and wellness. What does preventive care include?  A yearly physical exam. This is also called an annual well check.  Dental exams once or twice a year.  Routine eye exams. Ask your health care provider how often you should have your eyes checked.  Personal lifestyle choices, including: ? Daily care of your teeth and gums. ? Regular physical activity. ? Eating a healthy diet. ? Avoiding tobacco and drug use. ? Limiting alcohol use. ? Practicing safe sex. ? Taking vitamin and mineral supplements as recommended by your health care provider. What happens during an annual well check? The services and screenings done by your health care provider during your annual well check will depend on your age, overall health, lifestyle risk factors, and family history of disease. Counseling Your health care provider may ask you questions about your:  Alcohol use.  Tobacco use.  Drug use.  Emotional well-being.  Home and relationship well-being.  Sexual activity.  Eating habits.  Work and work Statistician.  Method of birth control.  Menstrual cycle.  Pregnancy history.  Screening You may have the following tests or measurements:  Height, weight, and BMI.  Diabetes screening. This is done by checking your blood sugar (glucose) after you have not eaten for a while (fasting).  Blood pressure.  Lipid and cholesterol levels. These may be checked every 5 years starting at age 66.  Skin check.  Hepatitis C blood test.  Hepatitis B blood test.  Sexually transmitted disease (STD) testing.  BRCA-related cancer screening. This may be done if you have a family history of breast, ovarian, tubal, or peritoneal cancers.  Pelvic exam and Pap test. This may be done every 3 years starting at age 40. Starting at age 59, this may be done every 5  years if you have a Pap test in combination with an HPV test.  Discuss your test results, treatment options, and if necessary, the need for more tests with your health care provider. Vaccines Your health care provider may recommend certain vaccines, such as:  Influenza vaccine. This is recommended every year.  Tetanus, diphtheria, and acellular pertussis (Tdap, Td) vaccine. You may need a Td booster every 10 years.  Varicella vaccine. You may need this if you have not been vaccinated.  HPV vaccine. If you are 69 or younger, you may need three doses over 6 months.  Measles, mumps, and rubella (MMR) vaccine. You may need at least one dose of MMR. You may also need a second dose.  Pneumococcal 13-valent conjugate (PCV13) vaccine. You may need this if you have certain conditions and were not previously vaccinated.  Pneumococcal polysaccharide (PPSV23) vaccine. You may need one or two doses if you smoke cigarettes or if you have certain conditions.  Meningococcal vaccine. One dose is recommended if you are age 27-21 years and a first-year college student living in a residence hall, or if you have one of several medical conditions. You may also need additional booster doses.  Hepatitis A vaccine. You may need this if you have certain conditions or if you travel or work in places where you may be exposed to hepatitis A.  Hepatitis B vaccine. You may need this if you have certain conditions or if you travel or work in places where you may be exposed to hepatitis B.  Haemophilus influenzae type b (Hib) vaccine. You may need this if  you have certain risk factors.  Talk to your health care provider about which screenings and vaccines you need and how often you need them. This information is not intended to replace advice given to you by your health care provider. Make sure you discuss any questions you have with your health care provider. Document Released: 12/05/2001 Document Revised: 06/28/2016  Document Reviewed: 08/10/2015 Elsevier Interactive Patient Education  [employer].

## 2017-11-16 NOTE — Progress Notes (Signed)
Subjective:    Anna ChesterfieldHolly Jaggers is a 42 y.o. [id number] Caucasian female who presents for a postpartum visit. She is 6 weeks postpartum following a spontaneous vaginal delivery at 38.2 gestational weeks. Anesthesia: epidural. I have fully reviewed the prenatal and intrapartum course. Postpartum course has been normal. Baby's course has been normal. Baby is feeding by bottle. Bleeding no bleeding. Bowel function is normal. Bladder function is normal. Patient is not sexually active. Last sexual activity: prior to delivery. Contraception method is none. Postpartum depression screening: negative. Score 0.  Last pap  07/2016 and was normal per pt.  The following portions of the patient's history were reviewed and updated as appropriate: allergies, current medications, past medical history, past surgical history and problem list.  Review of Systems Pertinent items are noted in HPI.   Vitals:   11/16/17 1447  BP: 110/76  Pulse: 86  Weight: 137 lb 2 oz (62.2 kg)  Height: 5\' 5"  (1.651 m)   No LMP recorded.  Objective:   General:  alert, cooperative and no distress   Breasts:  deferred, no complaints  Lungs: clear to auscultation bilaterally  Heart:  regular rate and rhythm  Abdomen: soft, nontender   Vulva: normal  Vagina: normal vagina  Cervix:  closed  Corpus: Well-involuted  Adnexa:  Non-palpable  Rectal Exam: Not inflamed hemorrhoids        Assessment:   Postpartum exam 6 wks s/p NSVD Bottle feeding Depression screening-negative Contraception counseling - pt would like to become pregnant again ASAP due to age. Counseled on potential risks of pregnancy close in timing, as well as increased risk due to maternal age. She verbalizes understanding.   Plan:  : none Follow up in: 9 months for annual /pap smear or earlier if needed  Doreene BurkeAnnie Shann Merrick , [name]

## 2017-12-14 ENCOUNTER — Ambulatory Visit: Payer: BLUE CROSS/BLUE SHIELD | Admitting: Gastroenterology

## 2018-02-18 ENCOUNTER — Other Ambulatory Visit: Payer: Self-pay | Admitting: Family Medicine

## 2018-02-18 DIAGNOSIS — Z1231 Encounter for screening mammogram for malignant neoplasm of breast: Secondary | ICD-10-CM

## 2018-03-06 ENCOUNTER — Encounter (INDEPENDENT_AMBULATORY_CARE_PROVIDER_SITE_OTHER): Payer: Self-pay

## 2018-03-06 ENCOUNTER — Ambulatory Visit
Admission: RE | Admit: 2018-03-06 | Discharge: 2018-03-06 | Disposition: A | Discharge status: Home / Self Care | Payer: BLUE CROSS/BLUE SHIELD | Source: Ambulatory Visit | Attending: Family Medicine | Admitting: Family Medicine

## 2018-03-06 DIAGNOSIS — Z1231 Encounter for screening mammogram for malignant neoplasm of breast: Secondary | ICD-10-CM | POA: Diagnosis present

## 2018-04-12 ENCOUNTER — Other Ambulatory Visit: Payer: Self-pay | Admitting: Orthopedic Surgery

## 2018-04-12 DIAGNOSIS — M75102 Unspecified rotator cuff tear or rupture of left shoulder, not specified as traumatic: Secondary | ICD-10-CM

## 2018-04-12 DIAGNOSIS — M5412 Radiculopathy, cervical region: Secondary | ICD-10-CM

## 2018-04-26 ENCOUNTER — Ambulatory Visit
Admission: RE | Admit: 2018-04-26 | Discharge: 2018-04-26 | Disposition: A | Discharge status: Home / Self Care | Payer: BLUE CROSS/BLUE SHIELD | Source: Ambulatory Visit | Attending: Orthopedic Surgery | Admitting: Orthopedic Surgery

## 2018-04-26 DIAGNOSIS — M5412 Radiculopathy, cervical region: Secondary | ICD-10-CM

## 2018-04-26 DIAGNOSIS — M4802 Spinal stenosis, cervical region: Secondary | ICD-10-CM | POA: Diagnosis not present

## 2018-04-26 DIAGNOSIS — M75102 Unspecified rotator cuff tear or rupture of left shoulder, not specified as traumatic: Secondary | ICD-10-CM

## 2018-04-26 DIAGNOSIS — M7582 Other shoulder lesions, left shoulder: Secondary | ICD-10-CM | POA: Diagnosis not present

## 2018-04-26 DIAGNOSIS — M50322 Other cervical disc degeneration at C5-C6 level: Secondary | ICD-10-CM | POA: Diagnosis not present

## 2018-08-05 ENCOUNTER — Ambulatory Visit (INDEPENDENT_AMBULATORY_CARE_PROVIDER_SITE_OTHER): Payer: BLUE CROSS/BLUE SHIELD | Admitting: Certified Nurse Midwife

## 2018-08-05 ENCOUNTER — Encounter: Payer: Self-pay | Admitting: Certified Nurse Midwife

## 2018-08-05 VITALS — BP 116/80 | HR 100 | Ht 65.0 in | Wt 143.6 lb

## 2018-08-05 DIAGNOSIS — Z01419 Encounter for gynecological examination (general) (routine) without abnormal findings: Secondary | ICD-10-CM | POA: Diagnosis not present

## 2018-08-05 NOTE — Patient Instructions (Signed)
Preventive Care 40-64 Years, Female Preventive care refers to lifestyle choices and visits with your health care provider that can promote health and wellness. What does preventive care include?  A yearly physical exam. This is also called an annual well check.  Dental exams once or twice a year.  Routine eye exams. Ask your health care provider how often you should have your eyes checked.  Personal lifestyle choices, including: ? Daily care of your teeth and gums. ? Regular physical activity. ? Eating a healthy diet. ? Avoiding tobacco and drug use. ? Limiting alcohol use. ? Practicing safe sex. ? Taking low-dose aspirin daily starting at age 43. ? Taking vitamin and mineral supplements as recommended by your health care provider. What happens during an annual well check? The services and screenings done by your health care provider during your annual well check will depend on your age, overall health, lifestyle risk factors, and family history of disease. Counseling Your health care provider may ask you questions about your:  Alcohol use.  Tobacco use.  Drug use.  Emotional well-being.  Home and relationship well-being.  Sexual activity.  Eating habits.  Work and work Statistician.  Method of birth control.  Menstrual cycle.  Pregnancy history.  Screening You may have the following tests or measurements:  Height, weight, and BMI.  Blood pressure.  Lipid and cholesterol levels. These may be checked every 5 years, or more frequently if you are over 87 years old.  Skin check.  Lung cancer screening. You may have this screening every year starting at age 31 if you have a 30-pack-year history of smoking and currently smoke or have quit within the past 15 years.  Fecal occult blood test (FOBT) of the stool. You may have this test every year starting at age 27.  Flexible sigmoidoscopy or colonoscopy. You may have a sigmoidoscopy every 5 years or a colonoscopy  every 10 years starting at age 28.  Hepatitis C blood test.  Hepatitis B blood test.  Sexually transmitted disease (STD) testing.  Diabetes screening. This is done by checking your blood sugar (glucose) after you have not eaten for a while (fasting). You may have this done every 1-3 years.  Mammogram. This may be done every 1-2 years. Talk to your health care provider about when you should start having regular mammograms. This may depend on whether you have a family history of breast cancer.  BRCA-related cancer screening. This may be done if you have a family history of breast, ovarian, tubal, or peritoneal cancers.  Pelvic exam and Pap test. This may be done every 3 years starting at age 22. Starting at age 41, this may be done every 5 years if you have a Pap test in combination with an HPV test.  Bone density scan. This is done to screen for osteoporosis. You may have this scan if you are at high risk for osteoporosis.  Discuss your test results, treatment options, and if necessary, the need for more tests with your health care provider. Vaccines Your health care provider may recommend certain vaccines, such as:  Influenza vaccine. This is recommended every year.  Tetanus, diphtheria, and acellular pertussis (Tdap, Td) vaccine. You may need a Td booster every 10 years.  Varicella vaccine. You may need this if you have not been vaccinated.  Zoster vaccine. You may need this after age 20.  Measles, mumps, and rubella (MMR) vaccine. You may need at least one dose of MMR if you were born in  1957 or later. You may also need a second dose.  Pneumococcal 13-valent conjugate (PCV13) vaccine. You may need this if you have certain conditions and were not previously vaccinated.  Pneumococcal polysaccharide (PPSV23) vaccine. You may need one or two doses if you smoke cigarettes or if you have certain conditions.  Meningococcal vaccine. You may need this if you have certain  conditions.  Hepatitis A vaccine. You may need this if you have certain conditions or if you travel or work in places where you may be exposed to hepatitis A.  Hepatitis B vaccine. You may need this if you have certain conditions or if you travel or work in places where you may be exposed to hepatitis B.  Haemophilus influenzae type b (Hib) vaccine. You may need this if you have certain conditions.  Talk to your health care provider about which screenings and vaccines you need and how often you need them. This information is not intended to replace advice given to you by your health care provider. Make sure you discuss any questions you have with your health care provider. Document Released: 11/05/2015 Document Revised: 06/28/2016 Document Reviewed: 08/10/2015 Elsevier Interactive Patient Education  [employer].

## 2018-08-05 NOTE — Progress Notes (Signed)
GYNECOLOGY ANNUAL PREVENTATIVE CARE ENCOUNTER NOTE  Subjective:   Anna Burch is a 42 y.o. [phone] female here for a routine annual gynecologic exam.  Current complaints: None. She is considering having another baby..   Denies abnormal vaginal bleeding, discharge, pelvic pain, problems with intercourse or other gynecologic concerns.    Gynecologic History Patient's last menstrual period was 07/10/2018 (exact date). Contraception: none Last Pap: 07/2017. Results were: normal Last mammogram: 2019. Results were: normal  Obstetric History OB History  Gravida Para Term Preterm AB Living  6 4 4   2 4   SAB TAB Ectopic Multiple Live Births    1   0 4    # Outcome Date GA Lbr Len/2nd Weight Sex Delivery Anes PTL Lv  6 Term 10/04/17 [redacted]w[redacted]d / 00:22 5 lb 6.8 oz (2.46 kg) M Vag-Spont EPI  LIV  5 Term 2006    F Vag-Spont EPI  LIV  4 AB 2004          3 TAB 2003          2 Term 1999    M Vag-Spont EPI  LIV  1 Term 1998    F Vag-Spont EPI  LIV    Past Medical History:  Diagnosis Date  . Anxiety   . Arthritis   . Asthma    no recent attacks  . Depression   . Diabetes mellitus without complication (HCC)   . Gastroesophageal reflux in pregnancy 07/28/2017  . Hemorrhoid     Past Surgical History:  Procedure Laterality Date  . BUNIONECTOMY Left 1992 and 1998   both left and right    Current Outpatient Medications on File Prior to Visit  Medication Sig Dispense Refill  . albuterol (PROVENTIL HFA;VENTOLIN HFA) 108 (90 Base) MCG/ACT inhaler Inhale into the lungs every 6 (six) hours as needed for wheezing or shortness of breath.    [name] buPROPion (WELLBUTRIN XL) 150 MG 24 hr tablet Take 1 tablet (150 mg total) by mouth daily. 90 tablet 1  . citalopram (CELEXA) 40 MG tablet Take 1 tablet (40 mg total) by mouth daily. 30 tablet 3  . clonazePAM (KLONOPIN) 0.5 MG tablet   2  . meloxicam (MOBIC) 15 MG tablet   0   No current facility-administered medications on file prior to visit.     No Known  Allergies  Social History:  reports that she has never smoked. She has never used smokeless tobacco. She reports that she does not drink alcohol or use drugs.  Family History  Problem Relation Age of Onset  . Diabetes Father   . Hyperlipidemia Father   . Hypertension Father   . Kidney disease Maternal Uncle   . Arthritis Maternal Grandmother   . Cancer Paternal Grandmother   . Breast cancer Paternal Grandmother 52  . Breast cancer Other 38    The following portions of the patient's history were reviewed and updated as appropriate: allergies, current medications, past family history, past medical history, past social history, past surgical history and problem list.  Review of Systems Pertinent items noted in HPI and remainder of comprehensive ROS otherwise negative.   Objective:  BP 116/80   Pulse 100   Ht 5\' 5"  (1.651 m)   Wt 143 lb 9 oz (65.1 kg)   LMP 07/10/2018 (Exact Date)   Breastfeeding? No   BMI 23.89 kg/m  CONSTITUTIONAL: Well-developed, well-nourished female in no acute distress.  HENT:  Normocephalic, atraumatic, External right and left ear normal. Oropharynx is clear  and moist EYES: Conjunctivae and EOM are normal. Pupils are equal, round, and reactive to light. No scleral icterus.  NECK: Normal range of motion, supple, no masses.  Normal thyroid.  SKIN: Skin is warm and dry. No rash noted. Not diaphoretic. No erythema. No pallor. MUSCULOSKELETAL: Normal range of motion. No tenderness.  No cyanosis, clubbing, or edema.  2+ distal pulses. NEUROLOGIC: Alert and oriented to person, place, and time. Normal reflexes, muscle tone coordination. No cranial nerve deficit noted. PSYCHIATRIC: Normal mood and affect. Normal behavior. Normal judgment and thought content. CARDIOVASCULAR: Normal heart rate noted, regular rhythm RESPIRATORY: Clear to auscultation bilaterally. Effort and breath sounds normal, no problems with respiration noted. BREASTS: Symmetric in size. No masses,  skin changes, nipple drainage, or lymphadenopathy. ABDOMEN: Soft, normal bowel sounds, no distention noted.  No tenderness, rebound or guarding.  PELVIC: Normal appearing external genitalia; normal appearing vaginal mucosa and cervix.  No abnormal discharge noted.  Pap smear not indicated .  Normal uterine size, no other palpable masses, no uterine or adnexal tenderness.    Assessment and Plan:  Annual well women exam Pap smear not indicated Mammogram due 2020 Discussed use of PNV when trying to conceive.  Discussed risks of pregnancy due to [facility]. Given her advance maternal age advised that she reach out for follow up if she does not conceive with in 4 months to discuss use of clomid and/or referral to fertility specialist Labs: lipid profile  Routine preventative health maintenance measures emphasized. Please refer to After Visit Summary for other counseling recommendations.   Doreene Burke, [name]

## 2018-08-06 LAB — LIPID PANEL
Chol/HDL Ratio: 4.2 ratio (ref 0.0–4.4)
Cholesterol, Total: 173 mg/dL (ref 100–199)
HDL: 41 mg/dL (ref >39)
LDL Calculated: 111 mg/dL — ABNORMAL HIGH (ref 0–99)
Triglycerides: 104 mg/dL (ref 0–149)
VLDL CHOLESTEROL CAL: 21 mg/dL (ref 5–40)

## 2019-01-08 ENCOUNTER — Encounter: Payer: Self-pay | Admitting: Certified Nurse Midwife

## 2019-01-08 ENCOUNTER — Other Ambulatory Visit: Payer: Self-pay

## 2019-01-08 ENCOUNTER — Ambulatory Visit (INDEPENDENT_AMBULATORY_CARE_PROVIDER_SITE_OTHER): Payer: BLUE CROSS/BLUE SHIELD | Admitting: Certified Nurse Midwife

## 2019-01-08 VITALS — BP 116/79 | HR 97 | Ht 65.0 in | Wt 145.6 lb

## 2019-01-08 DIAGNOSIS — Z319 Encounter for procreative management, unspecified: Secondary | ICD-10-CM

## 2019-01-08 MED ORDER — CLOMIPHENE CITRATE 50 MG PO TABS: 50.0000 mg | ORAL_TABLET | Freq: Every day | ORAL | 0 refills | Status: AC

## 2019-01-08 NOTE — Patient Instructions (Signed)
Female Infertility    Female infertility refers to a woman's inability to get pregnant (conceive) after a year of having sex regularly (or after 6 months in women over age 45) without using birth control. Infertility can also mean that a woman is not able to carry a pregnancy to full term.  Both women and men can have fertility problems.  What are the causes?  This condition may be caused by:  Problems with reproductive organs. Infertility can result if a woman:  Has an abnormally short cervix or a cervix that does not remain closed during a pregnancy.  Has a blockage or scarring in the fallopian tubes.  Has an abnormally shaped uterus.  Has uterine fibroids. This is a benign mass of tissue or muscle (tumor) that can develop in the uterus.  Is not ovulating in a regular way.  Certain medical conditions. These may include:  Polycystic ovary syndrome (PCOS). This is a hormonal disorder that can cause small cysts to grow on the ovaries. This is the most common cause of infertility in women.  Endometriosis. This is a condition in which the tissue that lines the uterus (endometrium) grows outside of its normal location.  Cancer and cancer treatments, such as chemotherapy or radiation.  Premature ovarian failure. This is when ovaries stop producing eggs and hormones before age 40.  Sexually transmitted diseases, such as chlamydia or gonorrhea.  Autoimmune disorders. These are disorders in which the body's defense system (immune system) attacks normal, healthy cells.  Infertility can be linked to more than one cause. For some women, the cause of infertility is not known (unexplained infertility).  What increases the risk?  Age. A woman's fertility declines with age, especially after her mid-30s.  Being underweight or overweight.  Drinking too much alcohol.  Using drugs such as anabolic steroids, cocaine, and marijuana.  Exercising excessively.  Being exposed to environmental toxins, such as radiation, pesticides, and  certain chemicals.  What are the signs or symptoms?  The main sign of infertility in women is the inability to get pregnant or carry a pregnancy to full term.  How is this diagnosed?  This condition may be diagnosed by:  Checking whether you are ovulating each month. The tests may include:  Blood tests to check hormone levels.  An ultrasound of the ovaries.  Taking a small tissue that lines the uterus and checking it under a microscope (endometrial biopsy).  Doing additional tests. This is done if ovulation is normal. Tests may include:  Hysterosalpingography. This X-ray test can show the shape of the uterus and whether the fallopian tubes are open.  Laparoscopy. This test uses a lighted tube (laparoscope) to look for problems in the fallopian tubes and other organs.  Transvaginal ultrasound. This imaging test is used to check for abnormalities in the uterus and ovaries.  Hysteroscopy. This test uses a lighted tube to check for problems in the cervix and the uterus.  To be diagnosed with infertility, both partners will have a physical exam. Both partners will also have an extensive medical and sexual history taken. Additional tests may be done.  How is this treated?  Treatment depends on the cause of infertility. Most cases of infertility in women are treated with medicine or surgery.  Women may take medicine to:  Correct ovulation problems.  Treat other health conditions.  Surgery may be done to:  Repair damage to the ovaries, fallopian tubes, cervix, or uterus.  Remove growths from the uterus.  Remove scar tissue   from the uterus, pelvis, or other organs.  Assisted reproductive technology (ART)  Assisted reproductive technology (ART) refers to all treatments and procedures that combine eggs and sperm outside the body to try to help a couple conceive. ART is often combined with fertility drugs to stimulate ovulation. Sometimes ART is done using eggs retrieved from another woman's body (donor eggs) or from previously  frozen fertilized eggs (embryos).  There are different types of ART. These include:  Intrauterine insemination (IUI). A long, thin tube is used to place sperm directly into a woman's uterus. This procedure:  Is effective for infertility caused by sperm problems, including low sperm count and low motility.  Can be used in combination with fertility drugs.  In vitro fertilization (IVF). This is done when a woman's fallopian tubes are blocked or when a man has low sperm count. In this procedure:  Fertility drugs are used to stimulate the ovaries to produce multiple eggs.  Once mature, these eggs are removed from the body and combined with the sperm to be fertilized.  The fertilized eggs are then placed into the woman's uterus.  Follow these instructions at home:  Take over-the-counter and prescription medicines only as told by your health care provider.  Do not use any products that contain nicotine or tobacco, such as cigarettes and e-cigarettes. If you need help quitting, ask your health care provider.  If you drink alcohol, limit how much you have to 1 drink a day.  Make dietary changes to lose weight or maintain a healthy weight. Work with your health care provider and a dietitian to set a weight-loss goal that is healthy and reasonable for you.  Seek support from a counselor or support group to talk about your concerns related to infertility. Couples counseling may be helpful for you and your partner.  Practice stress reduction techniques that work well for you, such as regular physical activity, meditation, or deep breathing.  Keep all follow-up visits as told by your health care provider. This is important.  Contact a health care provider if you:  Feel that stress is interfering with your life and relationships.  Have side effects from treatments for infertility.  Summary  Female infertility refers to a woman's inability to get pregnant (conceive) after a year of having sex regularly (or after 6 months in women  over age 45) without using birth control.  To be diagnosed with infertility, both partners will have a physical exam. Both partners will also have an extensive medical and sexual history taken.  Seek support from a counselor or support group to talk about your concerns related to infertility. Couples counseling may be helpful for you and your partner.  This information is not intended to replace advice given to you by your health care provider. Make sure you discuss any questions you have with your health care provider.  Document Released: 10/12/2003 Document Revised: 09/10/2017 Document Reviewed: 09/10/2017  Elsevier Interactive Patient Education © 2019 [employer]  •

## 2019-01-08 NOTE — Progress Notes (Signed)
GYN ENCOUNTER NOTE  Subjective:       Anna Burch is a 43 y.o. [phone] female is here for gynecologic evaluation of the following issues:  1. Trying to conceive for 6 months.    Gynecologic History Patient's last menstrual period was 01/05/2019 (exact date). Contraception: none Last Pap: 07/2017 Results were: normal Last mammogram: 02/24/2018 Results were: normal  Obstetric History OB History  Gravida Para Term Preterm AB Living  '6 4 4   2 4  ' SAB TAB Ectopic Multiple Live Births    1   0 4    # Outcome Date GA Lbr Len/2nd Weight Sex Delivery Anes PTL Lv  6 Term 10/04/17 [redacted]w[redacted]d/ 00:22 5 lb 6.8 oz (2.46 kg) M Vag-Spont EPI  LIV  5 Term 2006    F Vag-Spont EPI  LIV  4 AB 2004          3 TAB 2003          2 Term 1999    M Vag-Spont EPI  LIV  1 Term 1998    F Vag-Spont EPI  LIV    Past Medical History:  Diagnosis Date  . Anxiety   . Arthritis   . Asthma    no recent attacks  . Depression   . Diabetes mellitus without complication ([facility]   . Gastroesophageal reflux in pregnancy 07/28/2017  . Hemorrhoid     Past Surgical History:  Procedure Laterality Date  . BUNIONECTOMY Left 1992 and 1998   both left and right    Current Outpatient Medications on File Prior to Visit  Medication Sig Dispense Refill  . albuterol (PROVENTIL HFA;VENTOLIN HFA) 108 (90 Base) MCG/ACT inhaler Inhale into the lungs every 6 (six) hours as needed for wheezing or shortness of breath.    .Marland KitchenbuPROPion (WELLBUTRIN XL) 150 MG 24 hr tablet Take 1 tablet (150 mg total) by mouth daily. 90 tablet 1  . citalopram (CELEXA) 40 MG tablet Take 1 tablet (40 mg total) by mouth daily. 30 tablet 3  . clonazePAM (KLONOPIN) 0.5 MG tablet   2  . cyclobenzaprine (FLEXERIL) 5 MG tablet Take by mouth.    . diazepam (VALIUM) 5 MG tablet     . meloxicam (MOBIC) 15 MG tablet   0   No current facility-administered medications on file prior to visit.     No Known Allergies  Social History   Socioeconomic History  .  Marital status: Married    Spouse name: Not on file  . Number of children: Not on file  . Years of education: Not on file  . Highest education level: Not on file  Occupational History  . Not on file  Social Needs  . Financial resource strain: Not on file  . Food insecurity:    Worry: Not on file    Inability: Not on file  . Transportation needs:    Medical: Not on file    Non-medical: Not on file  Tobacco Use  . Smoking status: Never Smoker  . Smokeless tobacco: Never Used  Substance and Sexual Activity  . Alcohol use: No    Comment: wine cooler x5 year  . Drug use: No  . Sexual activity: Yes    Birth control/protection: None    Comment: Pregnant   Lifestyle  . Physical activity:    Days per week: Not on file    Minutes per session: Not on file  . Stress: Not on file  Relationships  . Social connections:  Talks on phone: Not on file    Gets together: Not on file    Attends religious service: Not on file    Active member of club or organization: Not on file    Attends meetings of clubs or organizations: Not on file    Relationship status: Not on file  . Intimate partner violence:    Fear of current or ex partner: Not on file    Emotionally abused: Not on file    Physically abused: Not on file    Forced sexual activity: Not on file  Other Topics Concern  . Not on file  Social History Narrative  . Not on file    Family History  Problem Relation Age of Onset  . Diabetes Father   . Hyperlipidemia Father   . Hypertension Father   . Kidney disease Maternal Uncle   . Arthritis Maternal Grandmother   . Cancer Paternal Grandmother   . Breast cancer Paternal Grandmother 36  . Breast cancer Other 60    The following portions of the patient's history were reviewed and updated as appropriate: allergies, current medications, past family history, past medical history, past social history, past surgical history and problem list.  Review of Systems Review of Systems -  Negative except as mentioned in HPI Review of Systems - General ROS: negative for - chills, fatigue, fever, hot flashes, malaise or night sweats Hematological and Lymphatic ROS: negative for - bleeding problems or swollen lymph nodes Gastrointestinal ROS: negative for - abdominal pain, blood in stools, change in bowel habits and nausea/vomiting Musculoskeletal ROS: negative for - joint pain, muscle pain or muscular weakness Genito-Urinary ROS: negative for - change in menstrual cycle, dysmenorrhea, dyspareunia, dysuria, genital discharge, genital ulcers, hematuria, incontinence, irregular/heavy menses, nocturia or pelvic pain. Has regular cycles q 28 days. Her partner had semen analysis 2 yrs ago.   Objective:   BP 116/79   Pulse 97   Ht '5\' 5"'  (1.651 m)   Wt 145 lb 9 oz (66 kg)   LMP 01/05/2019 (Exact Date)   BMI 24.22 kg/m  CONSTITUTIONAL: Well-developed, well-nourished female in no acute distress.  HENT:  Normocephalic, atraumatic.  NECK: Normal range of motion, supple, no masses.  Normal thyroid.  SKIN: Skin is warm and dry. No rash noted. Not diaphoretic. No erythema. No pallor. Fowler: Alert and oriented to person, place, and time. PSYCHIATRIC: Normal mood and affect. Normal behavior. Normal judgment and thought content. CARDIOVASCULAR:Not Examined RESPIRATORY: Not Examined BREASTS: Not Examined ABDOMEN: Soft, non distended; Non tender.  No Organomegaly. PELVIC: not indicated for fertility counseling.      Assessment:   Infertility    Plan:   Discussed use of clomid ( see below). Pt state she would like to try with clomid for 3 months and if she does not get pregnant then she does not wish to pursue if further with referral to fertility specialist. Encourage use of ovulation kit and progesterone level on day 21 or 22 cycle. [name]   CLOMID INSTRUCTIONS  WHY USE IT? Clomid helps your ovaries to release eggs (ovulate).  HOW TO USE IT? Clomid is taken as a pill usually on  days 5,6,7,8, & 9 of your cycle.  Day 1 is the first day of your period. The dose or duration may be changed to achieve ovulation.  Provera (progesterone) may first be used to bring on a period for some patients. The day of ovulation on Clomid is usually between cycle day 14 and 17.  Having sexual intercourse at least every other day between cycle day 13 and 18 will improve your chances of becoming pregnant during the Clomid cycle.  You may monitor your ovulation using basal body temperature charts or with ovulation kits.  If using the ovulation predictor kits, having intercourse the day of the surge and the two days following is recommended. If you get your period, call when it starts for an appointment with your doctor, so that an exam may be done, and another Clomid cycle can be considered if appropriate. If you do not get a period by day 35 of the cycle, please get a blood pregnancy test.  If it is negative, speak to your doctor for instructions to bring on another period and to plan a follow-up appointment.  THINGS TO KNOW: If you get pregnant while using Clomid, your chance of twins is 7%m and triplets is less than 1%. Some studies have suggested the use of "fertility drugs" may increase your risk of ovarian cancers in the future.  It is unclear if these drugs increase the risk, or people who have problems with fertility are prone for these cancers.  If there is an actual risk, it is very low.  If you have a history of liver problems or ovarian cancer, it may be wise to avoid this medication.  SIDE EFFECTS:  The most common side effect is hot flashes (20%).  Breast tenderness, headaches, nausea, bloating may also occur at different times.  Less than 3/1,000 people have dryness or loss of hair.  Persistent ovarian cysts may form from the use of this medication.  Ovarian hyperstimulation syndrome is a rare side effect at low doses.  Visual changes like flashes of light or blurring.   I attest  more than 50% of visit was spent reviewing history, discussing increased risk of pregnancy due to [facility]. Reviewed clomid , use , side effects, risks and benefits. Discussed starting at 50 mg x 1 month then increased to 100 mg. Face to face time 15 min.   Philip Aspen, [name]

## 2019-01-30 ENCOUNTER — Other Ambulatory Visit: Payer: Self-pay

## 2019-01-30 ENCOUNTER — Ambulatory Visit (INDEPENDENT_AMBULATORY_CARE_PROVIDER_SITE_OTHER): Payer: BLUE CROSS/BLUE SHIELD | Admitting: Podiatry

## 2019-01-30 ENCOUNTER — Ambulatory Visit: Payer: Self-pay

## 2019-01-30 ENCOUNTER — Encounter: Payer: Self-pay | Admitting: Podiatry

## 2019-01-30 VITALS — BP 105/57 | HR 75 | Resp 16

## 2019-01-30 DIAGNOSIS — Q828 Other specified congenital malformations of skin: Secondary | ICD-10-CM

## 2019-01-30 DIAGNOSIS — M778 Other enthesopathies, not elsewhere classified: Secondary | ICD-10-CM

## 2019-01-30 DIAGNOSIS — M779 Enthesopathy, unspecified: Principal | ICD-10-CM

## 2019-01-30 NOTE — Progress Notes (Signed)
Subjective:  Patient ID: Anna Burch, female    DOB: 09-10-1976,  MRN: [id number] HPI Chief Complaint  Patient presents with  . Foot Pain    Plantar forefoot left - aching, numbness x 2 months, developing calluses, also callused area medial hallux left, feels like she has no padding, notices sometimes numbness radiates into leg, tried softer shoes, filing calluses, and not going barefoot  . NOTE    Patient states its possible she could be [redacted] weeks pregnant - no xrays today  . New Patient (Initial Visit)    43 y.o. female presents with the above complaint.   ROS: Denies fever chills nausea vomiting muscle aches pains calf pain back pain chest pain shortness of breath headache.  Past Medical History:  Diagnosis Date  . Anxiety   . Arthritis   . Asthma    no recent attacks  . Depression   . Diabetes mellitus without complication (HCC)   . Gastroesophageal reflux in pregnancy 07/28/2017  . Hemorrhoid    Past Surgical History:  Procedure Laterality Date  . BUNIONECTOMY Left 1992 and 1998   both left and right    Current Outpatient Medications:  .  SERTRALINE HCL PO, Take by mouth., Disp: , Rfl:  .  albuterol (PROVENTIL HFA;VENTOLIN HFA) 108 (90 Base) MCG/ACT inhaler, Inhale into the lungs every 6 (six) hours as needed for wheezing or shortness of breath., Disp: , Rfl:  .  buPROPion (WELLBUTRIN XL) 150 MG 24 hr tablet, Take 1 tablet (150 mg total) by mouth daily., Disp: 90 tablet, Rfl: 1 .  citalopram (CELEXA) 40 MG tablet, Take 1 tablet (40 mg total) by mouth daily., Disp: 30 tablet, Rfl: 3 .  clonazePAM (KLONOPIN) 0.5 MG tablet, , Disp: , Rfl: 2 .  cyclobenzaprine (FLEXERIL) 5 MG tablet, Take by mouth., Disp: , Rfl:  .  meloxicam (MOBIC) 15 MG tablet, , Disp: , Rfl: 0 .  nystatin cream (MYCOSTATIN), , Disp: , Rfl:   Allergies  Allergen Reactions  . Amoxicillin Rash   Review of Systems Objective:   Vitals:   01/30/19 1005  BP: (!) 105/57  Pulse: 75  Resp: 16     General: Well developed, nourished, in no acute distress, alert and oriented x3   Dermatological: Skin is warm, dry and supple bilateral. Nails x 10 are well maintained; remaining integument appears unremarkable at this time. There are no open sores, no preulcerative lesions, no rash or signs of infection present.  Vascular: Dorsalis Pedis artery and Posterior Tibial artery pedal pulses are 2/4 bilateral with immedate capillary fill time. Pedal hair growth present. No varicosities and no lower extremity edema present bilateral.   Neruologic: Grossly intact via light touch bilateral. Vibratory intact via tuning fork bilateral. Protective threshold with Semmes Wienstein monofilament intact to all pedal sites bilateral. Patellar and Achilles deep tendon reflexes 2+ bilateral. No Babinski or clonus noted bilateral.   Musculoskeletal: No gross boney pedal deformities bilateral. No pain, crepitus, or limitation noted with foot and ankle range of motion bilateral. Muscular strength 5/5 in all groups tested bilateral.  Previous bunion deformities which have been repaired bilaterally.  She has tenderness on range of motion of the lesser toes and pain on palpation of reactive hyperkeratotic lesions plantar aspect of the forefoot sub-first subsecond and sub-fifth bilaterally left greater than right.  Gait: Unassisted, Nonantalgic.    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  Plan: At this point I debrided all reactive hyperkeratotic tissue she will follow-up with Raiford Nobleick as soon as possible for a set of orthotics to be made.  She will discuss with him the thickness problems that she had with a previous set of orthotics but we need to offload the forefoot make sure that she has the least amount of pressure along the metatarsal phalangeal joints as possible.       T. SeacliffHyatt, North DakotaDPM

## 2019-02-05 ENCOUNTER — Ambulatory Visit (INDEPENDENT_AMBULATORY_CARE_PROVIDER_SITE_OTHER): Payer: BLUE CROSS/BLUE SHIELD | Admitting: Orthotics

## 2019-02-05 ENCOUNTER — Other Ambulatory Visit: Payer: Self-pay

## 2019-02-05 DIAGNOSIS — Q828 Other specified congenital malformations of skin: Secondary | ICD-10-CM

## 2019-02-05 DIAGNOSIS — M7752 Other enthesopathy of left foot: Secondary | ICD-10-CM | POA: Diagnosis not present

## 2019-02-05 DIAGNOSIS — M778 Other enthesopathies, not elsewhere classified: Secondary | ICD-10-CM

## 2019-02-05 DIAGNOSIS — M7751 Other enthesopathy of right foot: Secondary | ICD-10-CM | POA: Diagnosis not present

## 2019-02-05 DIAGNOSIS — M779 Enthesopathy, unspecified: Principal | ICD-10-CM

## 2019-02-05 NOTE — Progress Notes (Signed)
Patient came in today for evaluation/assessment custom foot orthotics.  Patient presents foot pain and discomfort associated with metatarsalgia Patient has noted pes cavus foot type with also rear foot varus deformity. Marland KitchenMarland KitchenMarland KitchenGoal is to "bring ground up" with contoured arch support, and b/l met pad.

## 2019-02-13 ENCOUNTER — Other Ambulatory Visit: Payer: Self-pay | Admitting: Family Medicine

## 2019-02-13 DIAGNOSIS — Z1231 Encounter for screening mammogram for malignant neoplasm of breast: Secondary | ICD-10-CM

## 2019-02-19 ENCOUNTER — Other Ambulatory Visit: Payer: Self-pay

## 2019-02-19 ENCOUNTER — Other Ambulatory Visit (INDEPENDENT_AMBULATORY_CARE_PROVIDER_SITE_OTHER): Payer: BLUE CROSS/BLUE SHIELD | Admitting: Orthotics

## 2019-02-26 ENCOUNTER — Other Ambulatory Visit: Payer: BLUE CROSS/BLUE SHIELD | Admitting: Orthotics

## 2019-04-11 ENCOUNTER — Ambulatory Visit: Payer: Medicaid Other | Attending: Family Medicine

## 2019-04-21 ENCOUNTER — Inpatient Hospital Stay: Admission: RE | Admit: 2019-04-21 | Payer: Self-pay | Source: Ambulatory Visit

## 2019-05-20 ENCOUNTER — Ambulatory Visit
Admission: RE | Admit: 2019-05-20 | Discharge: 2019-05-20 | Disposition: A | Discharge status: Home / Self Care | Payer: BC Managed Care – PPO | Source: Ambulatory Visit | Attending: Family Medicine | Admitting: Family Medicine

## 2019-05-20 ENCOUNTER — Other Ambulatory Visit: Payer: Self-pay

## 2019-05-20 DIAGNOSIS — Z1231 Encounter for screening mammogram for malignant neoplasm of breast: Secondary | ICD-10-CM | POA: Diagnosis not present

## 2019-08-08 ENCOUNTER — Encounter: Payer: Self-pay | Admitting: Certified Nurse Midwife

## 2019-08-08 ENCOUNTER — Other Ambulatory Visit (HOSPITAL_COMMUNITY)
Admission: RE | Admit: 2019-08-08 | Discharge: 2019-08-08 | Disposition: A | Discharge status: Home / Self Care | Payer: Commercial Managed Care - PPO | Source: Ambulatory Visit | Attending: Certified Nurse Midwife | Admitting: Certified Nurse Midwife

## 2019-08-08 ENCOUNTER — Other Ambulatory Visit: Payer: Self-pay

## 2019-08-08 ENCOUNTER — Ambulatory Visit (INDEPENDENT_AMBULATORY_CARE_PROVIDER_SITE_OTHER): Payer: Commercial Managed Care - PPO | Admitting: Certified Nurse Midwife

## 2019-08-08 ENCOUNTER — Encounter: Payer: BLUE CROSS/BLUE SHIELD | Admitting: Certified Nurse Midwife

## 2019-08-08 VITALS — BP 109/76 | HR 86 | Ht 65.0 in | Wt 152.5 lb

## 2019-08-08 DIAGNOSIS — Z01419 Encounter for gynecological examination (general) (routine) without abnormal findings: Secondary | ICD-10-CM | POA: Diagnosis not present

## 2019-08-08 DIAGNOSIS — Z124 Encounter for screening for malignant neoplasm of cervix: Secondary | ICD-10-CM | POA: Diagnosis not present

## 2019-08-08 NOTE — Progress Notes (Signed)
GYNECOLOGY ANNUAL PREVENTATIVE CARE ENCOUNTER NOTE  History:     Addison Freimuth is a 43 y.o. [phone] female here for a routine annual gynecologic exam.  Current complaints: none.   Denies abnormal vaginal bleeding, discharge, pelvic pain, problems with intercourse or other gynecologic concerns.    Gynecologic History Patient's last menstrual period was 07/26/2019 (exact date). Contraception: none Last Pap: 07/2016 Results were:WNL per pt Last mammogram: 05/21/19 Results were: normal  Obstetric History OB History  Gravida Para Term Preterm AB Living  6 4 4   2 4   SAB TAB Ectopic Multiple Live Births    1   0 4    # Outcome Date GA Lbr Len/2nd Weight Sex Delivery Anes PTL Lv  6 Term 10/04/17 [redacted]w[redacted]d / 00:22 5 lb 6.8 oz (2.46 kg) M Vag-Spont EPI  LIV  5 Term 2006   6 lb 8 oz (2.948 kg) F Vag-Spont EPI  LIV  4 AB 2004          3 TAB 2003          2 Term 1999   6 lb 8 oz (2.948 kg) M Vag-Spont EPI  LIV  1 Term 1998   7 lb 4 oz (3.289 kg) F Vag-Spont EPI  LIV    Past Medical History:  Diagnosis Date  . Anxiety   . Arthritis   . Asthma    no recent attacks  . Depression   . Diabetes mellitus without complication (HCC)   . Gastroesophageal reflux in pregnancy 07/28/2017  . Hemorrhoid     Past Surgical History:  Procedure Laterality Date  . BUNIONECTOMY Left 1992 and 1998   both left and right    Current Outpatient Medications on File Prior to Visit  Medication Sig Dispense Refill  . albuterol (PROVENTIL HFA;VENTOLIN HFA) 108 (90 Base) MCG/ACT inhaler Inhale into the lungs every 6 (six) hours as needed for wheezing or shortness of breath.    09/27/2017 buPROPion (WELLBUTRIN XL) 150 MG 24 hr tablet Take 1 tablet (150 mg total) by mouth daily. 90 tablet 1  . citalopram (CELEXA) 40 MG tablet Take 1 tablet (40 mg total) by mouth daily. 30 tablet 3  . clonazePAM (KLONOPIN) 0.5 MG tablet   2  . cyclobenzaprine (FLEXERIL) 5 MG tablet Take by mouth.    . Diclofenac Sodium CR 100 MG 24 hr tablet  TK 1 T PO QD PRN    . meloxicam (MOBIC) 15 MG tablet   0  . nystatin cream (MYCOSTATIN)     . SERTRALINE HCL PO Take by mouth.    . SYMBICORT 80-4.5 MCG/ACT inhaler INL 2 PFS PO Q 12 H     No current facility-administered medications on file prior to visit.     Allergies  Allergen Reactions  . Amoxicillin Rash    Social History:  reports that she has never smoked. She has never used smokeless tobacco. She reports that she does not drink alcohol or use drugs.  Family History  Problem Relation Age of Onset  . Diabetes Father   . Hyperlipidemia Father   . Hypertension Father   . Kidney disease Maternal Uncle   . Arthritis Maternal Grandmother   . Cancer Paternal Grandmother   . Breast cancer Paternal Grandmother 35  . Breast cancer Other 35    The following portions of the patient's history were reviewed and updated as appropriate: allergies, current medications, past family history, past medical history, past social history, past surgical history and  problem list.  Review of Systems Pertinent items noted in HPI and remainder of comprehensive ROS otherwise negative.  Physical Exam:  BP 109/76   Pulse 86   Ht 5\' 5"  (1.651 m)   Wt 152 lb 8 oz (69.2 kg)   LMP 07/26/2019 (Exact Date)   Breastfeeding No   BMI 25.38 kg/m  CONSTITUTIONAL: Well-developed, well-nourished female in no acute distress.  HENT:  Normocephalic, atraumatic, External right and left ear normal. Oropharynx is clear and moist EYES: Conjunctivae and EOM are normal. Pupils are equal, round, and reactive to light. No scleral icterus.  NECK: Normal range of motion, supple, no masses.  Normal thyroid.  SKIN: Skin is warm and dry. No rash noted. Not diaphoretic. No erythema. No pallor. MUSCULOSKELETAL: Normal range of motion. No tenderness.  No cyanosis, clubbing, or edema.  2+ distal pulses. NEUROLOGIC: Alert and oriented to person, place, and time. Normal reflexes, muscle tone coordination. No cranial nerve  deficit noted. PSYCHIATRIC: Normal mood and affect. Normal behavior. Normal judgment and thought content. CARDIOVASCULAR: Normal heart rate noted, regular rhythm RESPIRATORY: Clear to auscultation bilaterally. Effort and breath sounds normal, no problems with respiration noted. BREASTS: Symmetric in size. No masses, skin changes, nipple drainage, or lymphadenopathy. ABDOMEN: Soft, normal bowel sounds, no distention noted.  No tenderness, rebound or guarding.  PELVIC: Normal appearing external genitalia; normal appearing vaginal mucosa and cervix.  No abnormal discharge noted.  Pap smear obtained.  Contact bleeding present. Normal uterine size, no other palpable masses, no uterine or adnexal tenderness.   Assessment and Plan:  Annual [facility] Women Gyn Exam Will follow up results of pap smear and manage accordingly. Mammogram done 05/21/19 Labs: fasting glucose collected  Routine preventative health maintenance measures emphasized. Please refer to After Visit Summary for other counseling recommendations.      Philip Aspen, [name]

## 2019-08-09 LAB — GLUCOSE, RANDOM: Glucose: 88 mg/dL (ref 65–99)

## 2019-08-14 LAB — CYTOLOGY - PAP
Adequacy: ABSENT
Comment: NEGATIVE
Diagnosis: NEGATIVE
High risk HPV: NEGATIVE

## 2020-07-05 IMAGING — MG DIGITAL SCREENING BILATERAL MAMMOGRAM WITH TOMO AND CAD
8 series · 8 of 24 positions shown · non-contrast
Comparison: Previous exam(s).

CLINICAL DATA: Screening.

EXAM:
DIGITAL SCREENING BILATERAL MAMMOGRAM WITH TOMO AND CAD

[R CC synth-2D]
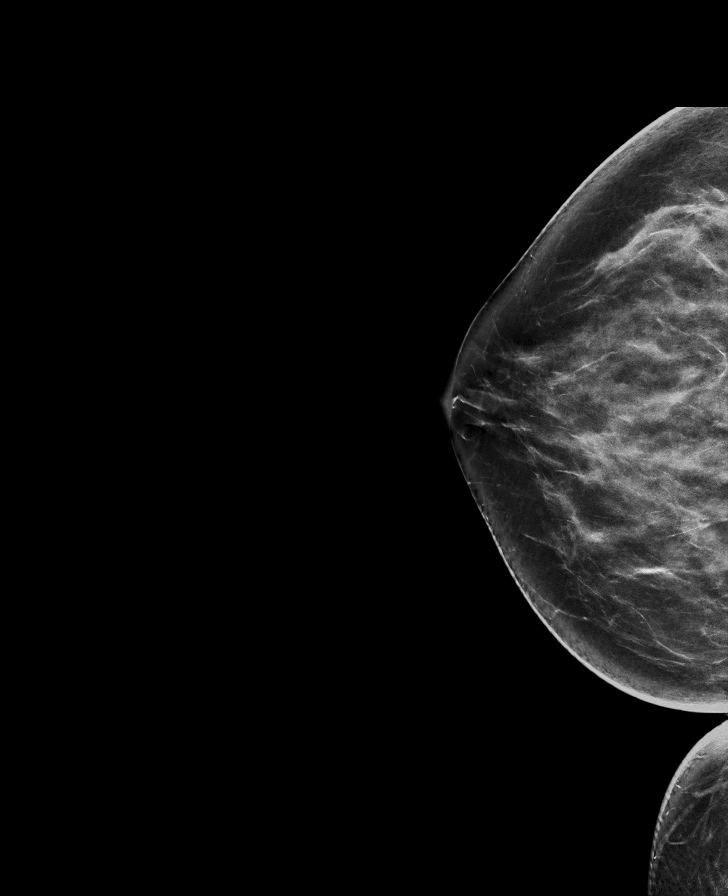

[L MLO synth-2D]
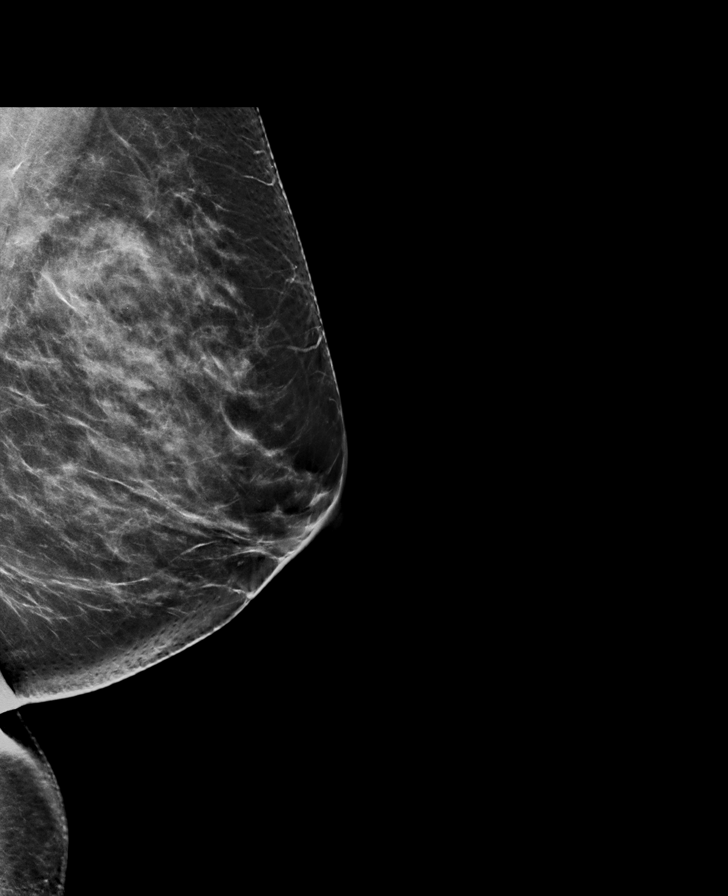

[L CC synth-2D]
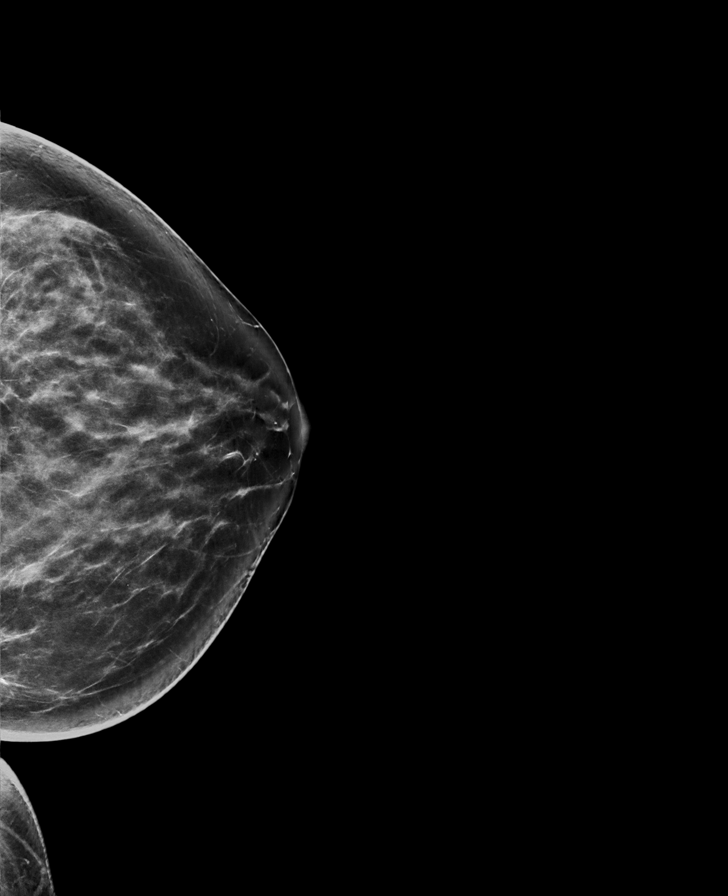

[R MLO synth-2D]
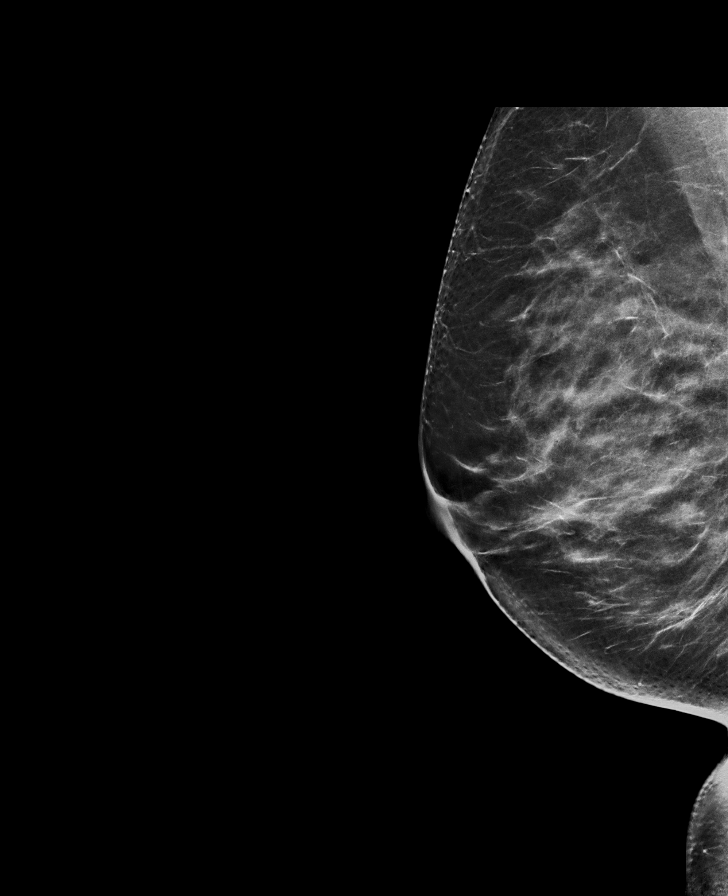

[R CC tomo · tomo slice 41/82.0]
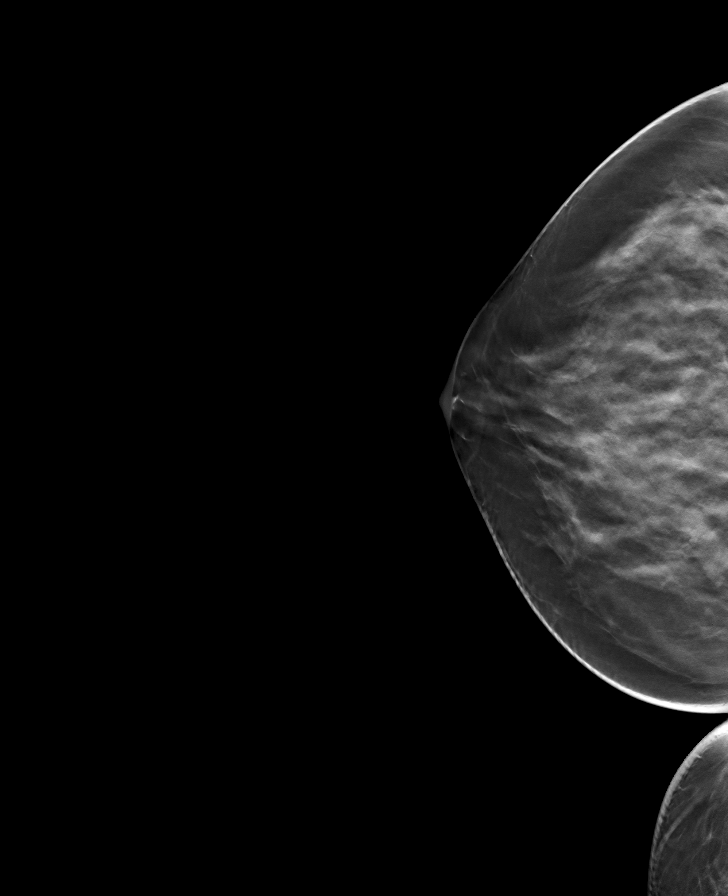

[R MLO tomo · tomo slice 43/85.0]
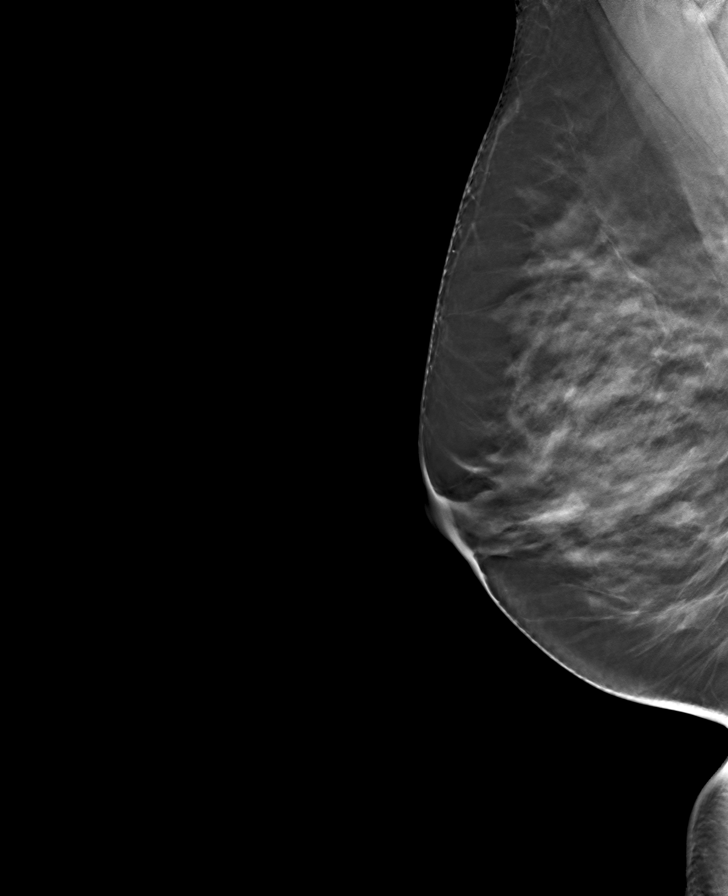

[L CC tomo · tomo slice 43/84.0]
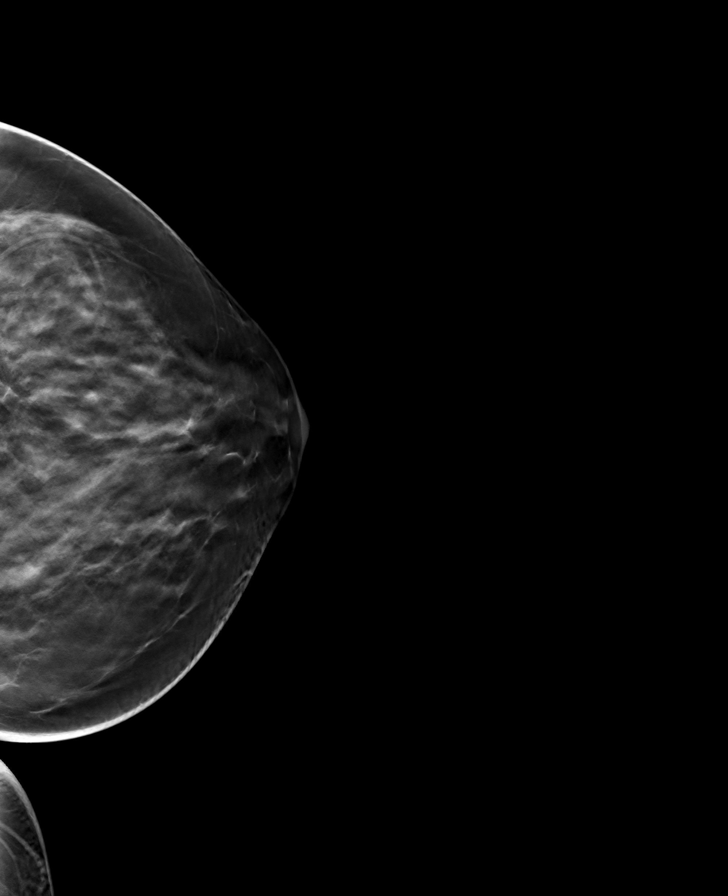

[L MLO tomo · tomo slice 43/85.0]
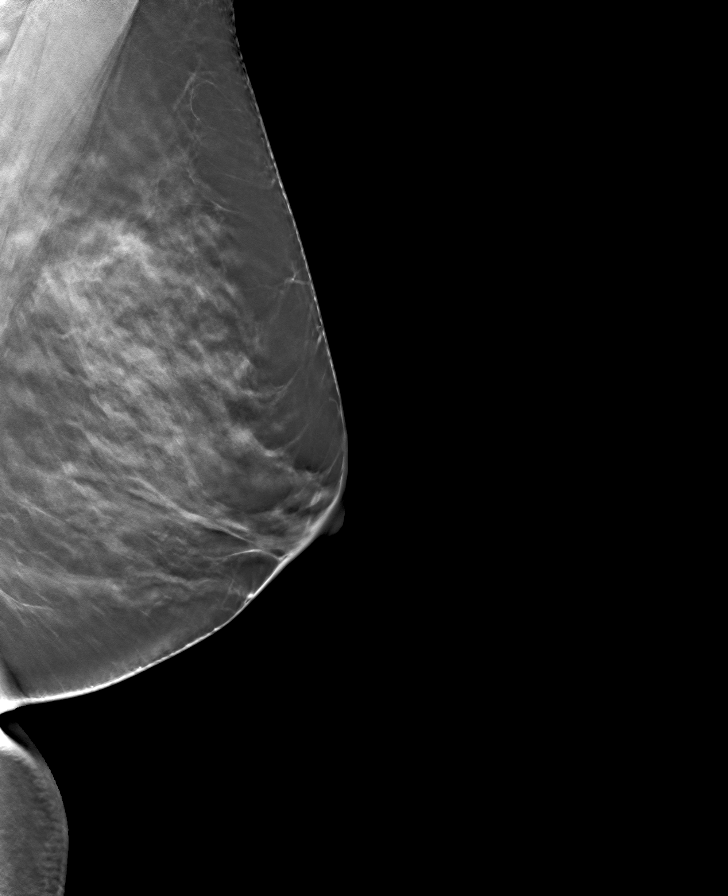

[8 of 24 positions shown; findings below may reference images not displayed]

ACR Breast Density Category c: The breast tissue is heterogeneously
dense, which may obscure small masses.
FINDINGS: There are no findings suspicious for malignancy. Images were
processed with CAD.
IMPRESSION: No mammographic evidence of malignancy. A result letter of this
screening mammogram will be mailed directly to the patient.

RECOMMENDATION:
Screening mammogram in one year. (Code:FT-U-LHB)

BI-RADS CATEGORY  1: Negative.

## 2020-08-11 ENCOUNTER — Encounter: Payer: Self-pay | Admitting: Certified Nurse Midwife
